# Patient Record
Sex: Male | Born: 1945 | Race: White | Hispanic: No | Marital: Married | Smoking: Never smoker
Health system: Southern US, Community
[De-identification: ages and names within clinical notes are randomized; demographics above are authoritative.]

## PROBLEM LIST (undated history)

## (undated) DIAGNOSIS — F909 Attention-deficit hyperactivity disorder, unspecified type: Secondary | ICD-10-CM

## (undated) DIAGNOSIS — I480 Paroxysmal atrial fibrillation: Secondary | ICD-10-CM

## (undated) DIAGNOSIS — N529 Male erectile dysfunction, unspecified: Secondary | ICD-10-CM

## (undated) HISTORY — DX: Paroxysmal atrial fibrillation: I48.0

## (undated) HISTORY — PX: NO PAST SURGERIES: SHX2092

## (undated) HISTORY — PX: COLONOSCOPY WITH PROPOFOL: SHX5780

## (undated) HISTORY — PX: SHOULDER SURGERY: SHX246

---

## 2010-10-09 ENCOUNTER — Ambulatory Visit: Payer: Self-pay | Admitting: Gastroenterology

## 2011-07-18 ENCOUNTER — Ambulatory Visit: Payer: Self-pay | Admitting: Family Medicine

## 2012-09-23 IMAGING — US US CAROTID DUPLEX BILAT
1 series · 17 of 24 positions shown · non-contrast
Comparison: none

REASON FOR EXAM: Visual Disturbance
COMMENTS:

PROCEDURE:     GOZZI - GOZZI CAROTID DOPPLER BILATERAL  - July 18, 2011  [DATE]
RESULT:     Comparison: None
TECHNIQUE: Gray-scale, color Doppler, and spectral Doppler images were
obtained of the extracranial carotid artery systems and vertebral arteries
in the neck.

[Series 1: us carotid duplex bilat · 17 of 72 slices shown]
[im 1/72]
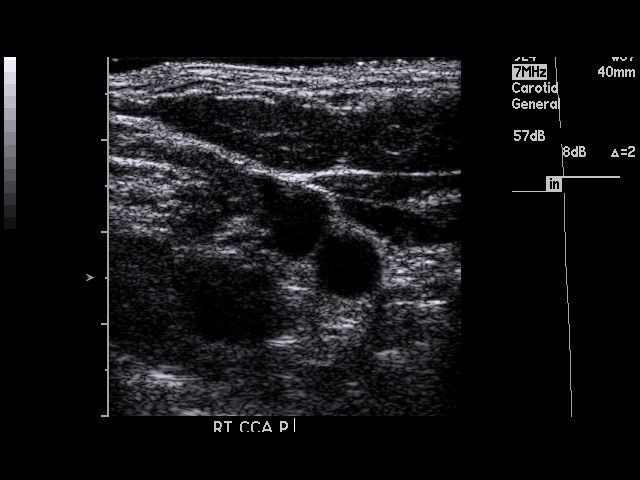
[im 7/72]
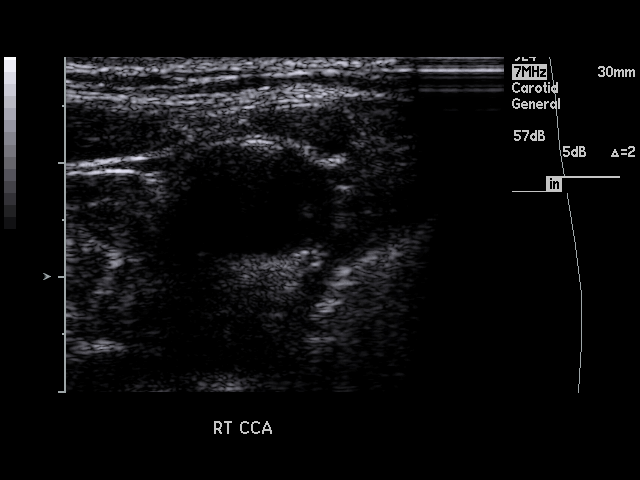
[im 10/72]
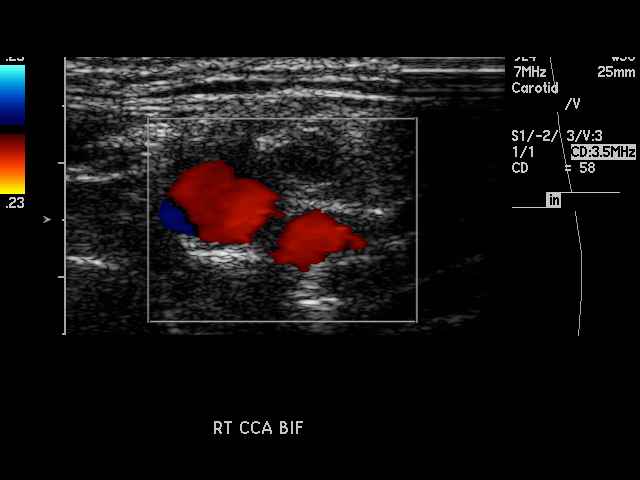
[im 13/72]
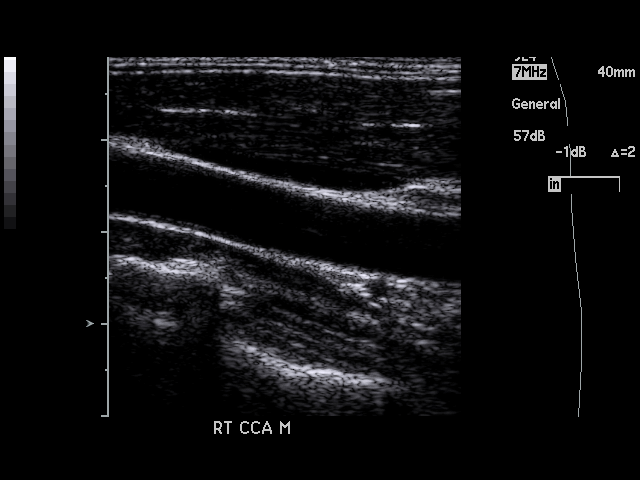
[im 19/72]
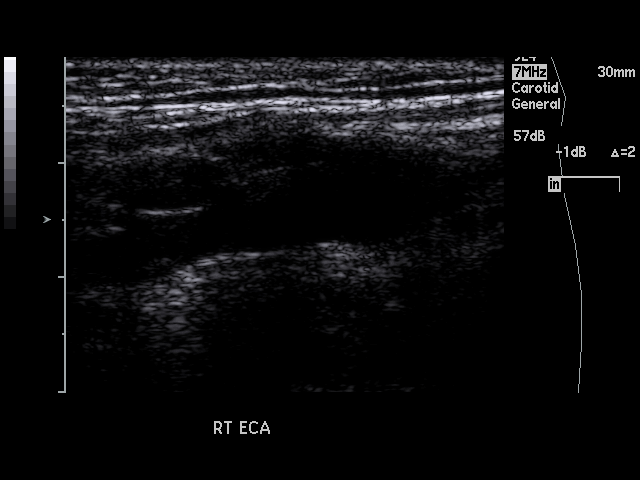
[im 22/72]
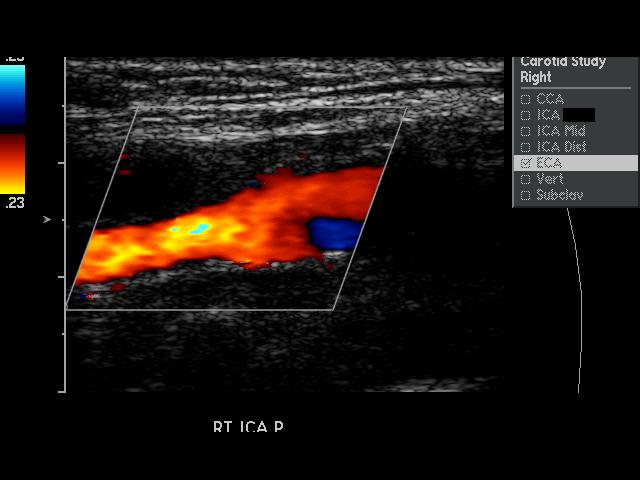
[im 28/72]
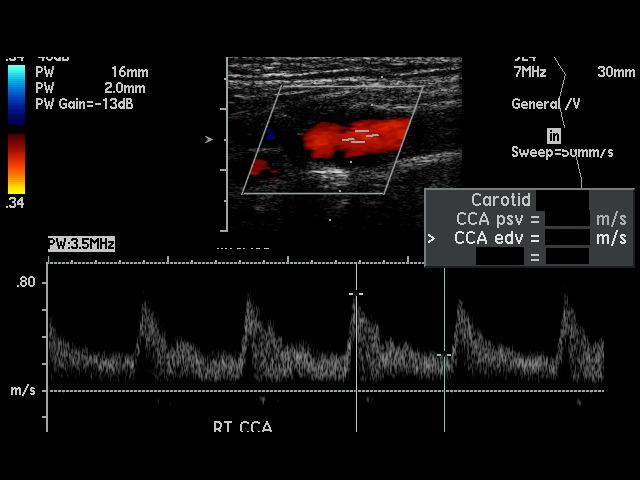
[im 31/72]
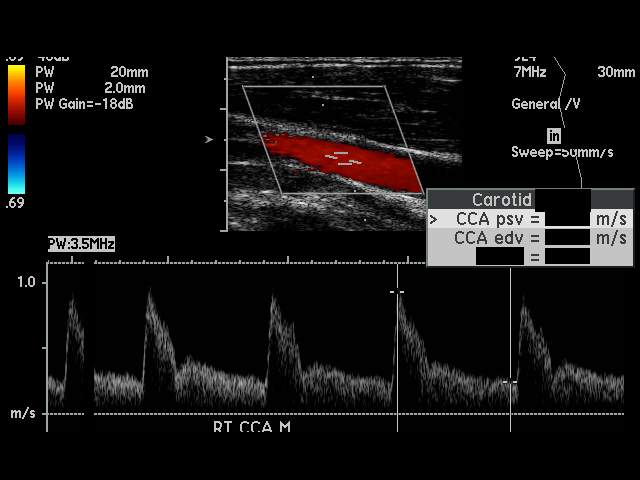
[im 38/72]
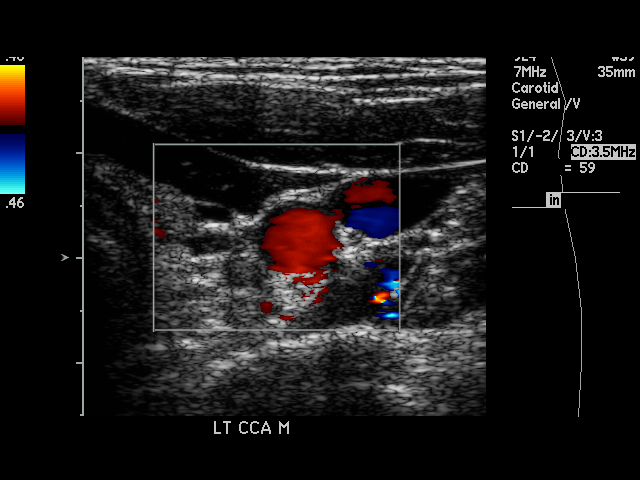
[im 41/72]
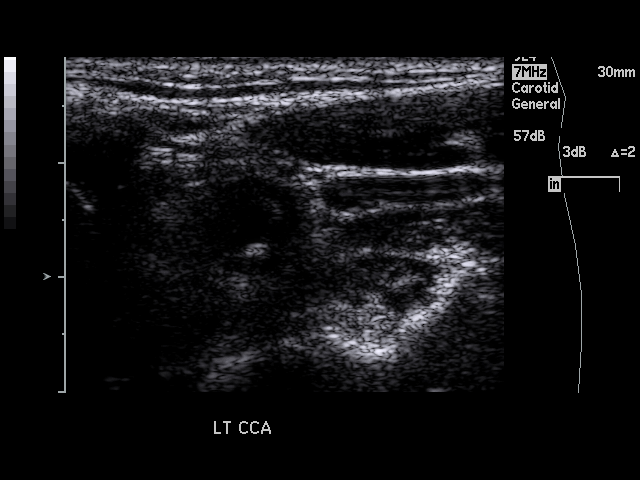
[im 44/72]
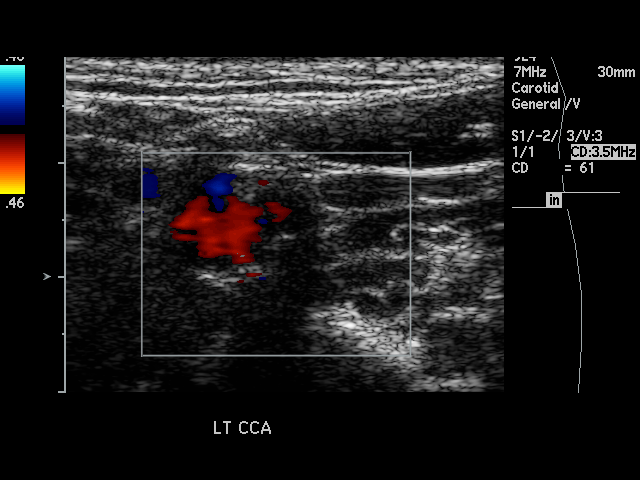
[im 50/72]
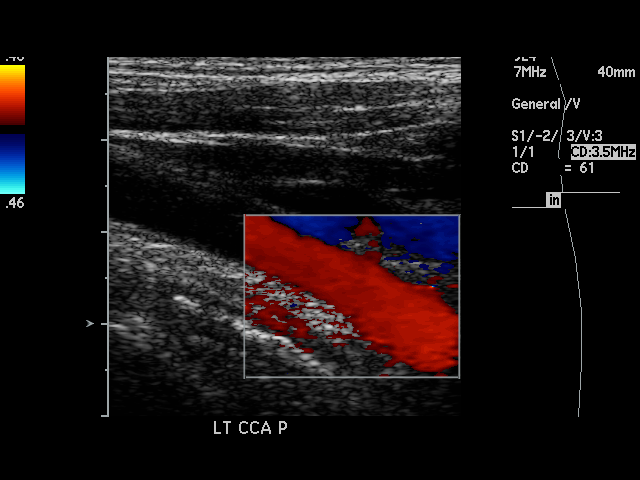
[im 53/72]
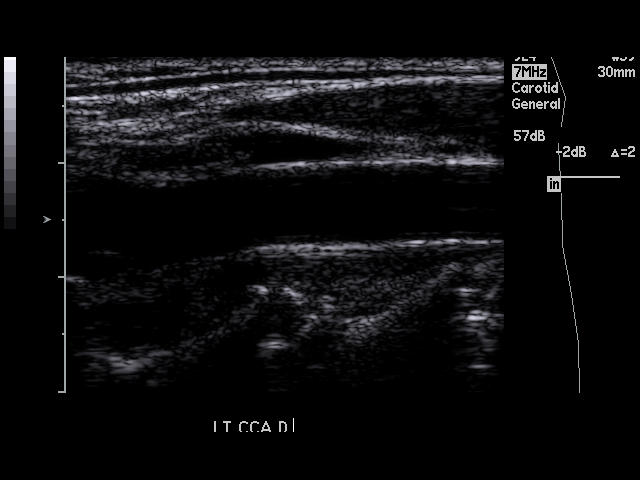
[im 59/72]
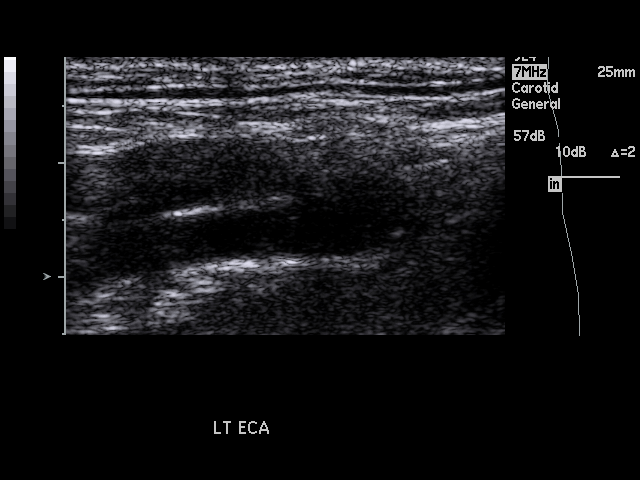
[im 62/72]
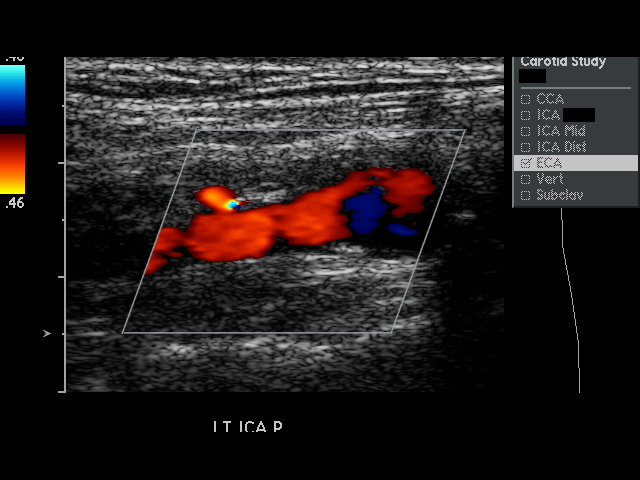
[im 65/72]
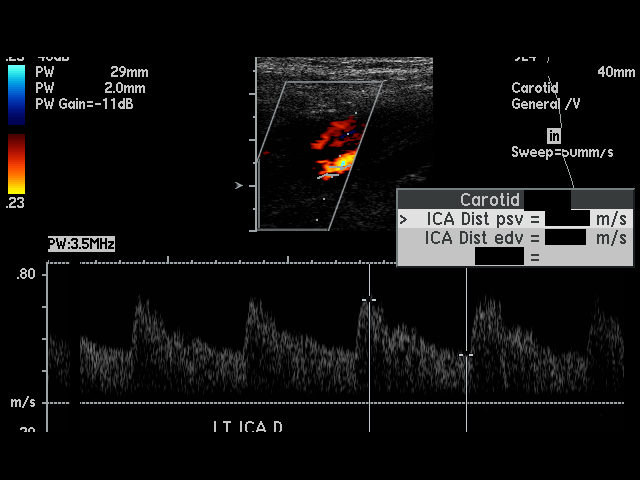
[im 72/72]
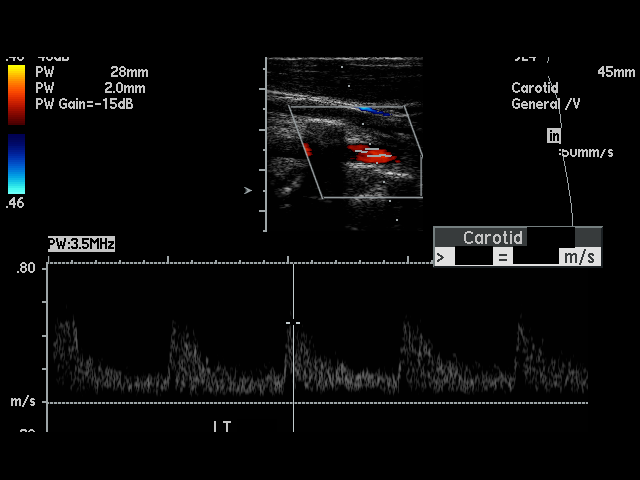

[17 of 24 positions shown; findings below may reference images not displayed]

FINDINGS: Mild atherosclerotic plaque is demonstrated in the right carotid bulb and
internal carotid artery. The peak systolic velocities in the right internal
carotid artery are not elevated. Mild to moderate atherosclerotic plaque is
demonstrated in the left common and internal carotid arteries. Shadowing
from the atherosclerotic plaque limits visualization. However, the peak
systolic velocities are not elevated. The right ICA to CCA ratio is 0.9. The
left ICA to CCA ratio 0.8.

The vertebral arteries are patent and demonstrate antegrade flow.
IMPRESSION: No evidence of hemodynamically significant stenosis in the extracranial
carotid arteries.

## 2018-01-15 ENCOUNTER — Other Ambulatory Visit: Payer: Self-pay | Admitting: Family Medicine

## 2018-01-15 DIAGNOSIS — H539 Unspecified visual disturbance: Secondary | ICD-10-CM

## 2018-01-28 ENCOUNTER — Ambulatory Visit
Admission: RE | Admit: 2018-01-28 | Discharge: 2018-01-28 | Disposition: A | Discharge status: Home / Self Care | Payer: Medicare Other | Source: Ambulatory Visit | Attending: Family Medicine | Admitting: Family Medicine

## 2018-01-28 DIAGNOSIS — H539 Unspecified visual disturbance: Secondary | ICD-10-CM | POA: Insufficient documentation

## 2018-09-09 IMAGING — US US CAROTID DUPLEX BILAT
1 series · 13 of 24 positions shown · non-contrast
Comparison: 07/18/2011

CLINICAL DATA: 72-year-old male with a history of visual
disturbance.

Cardiovascular risk factors listed are none
EXAM:
BILATERAL CAROTID DUPLEX ULTRASOUND
TECHNIQUE: Gray scale imaging, color Doppler and duplex ultrasound were
performed of bilateral carotid and vertebral arteries in the neck.

[Series 1: us carotid duplex bilat · 0.06mm/px · 13 of 70 slices shown]
[im 1/70]
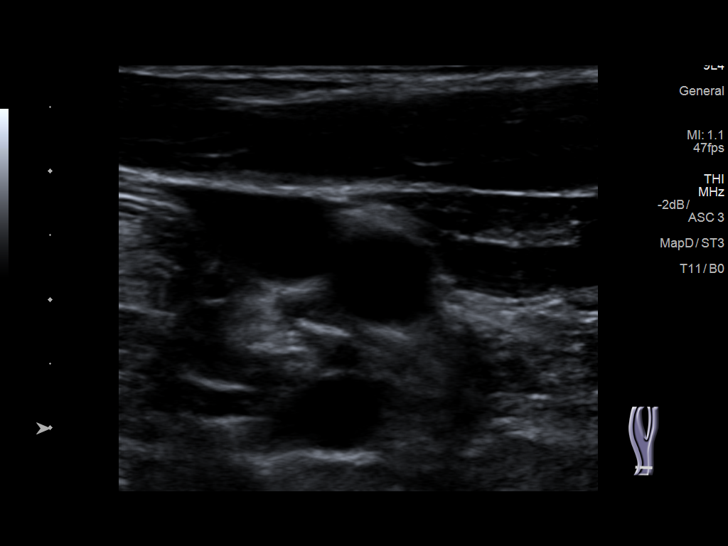
[im 7/70]
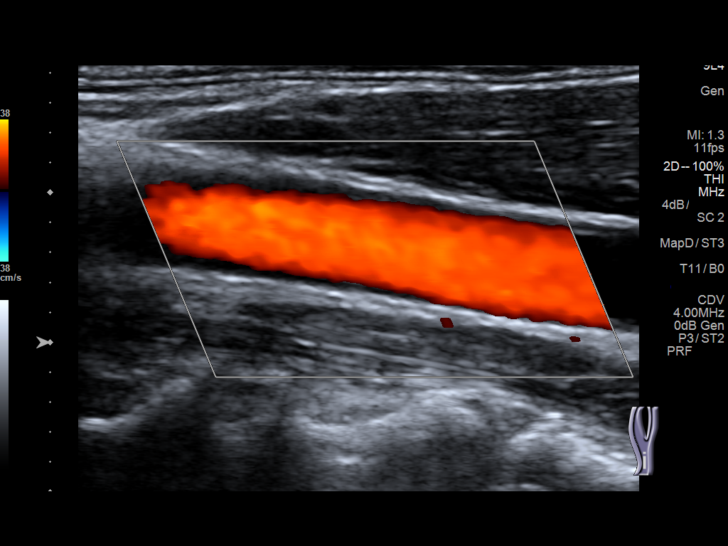
[im 13/70]
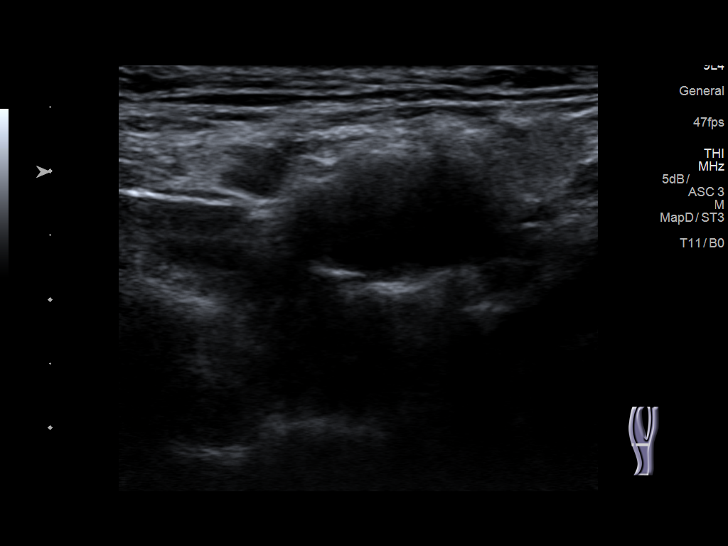
[im 19/70]
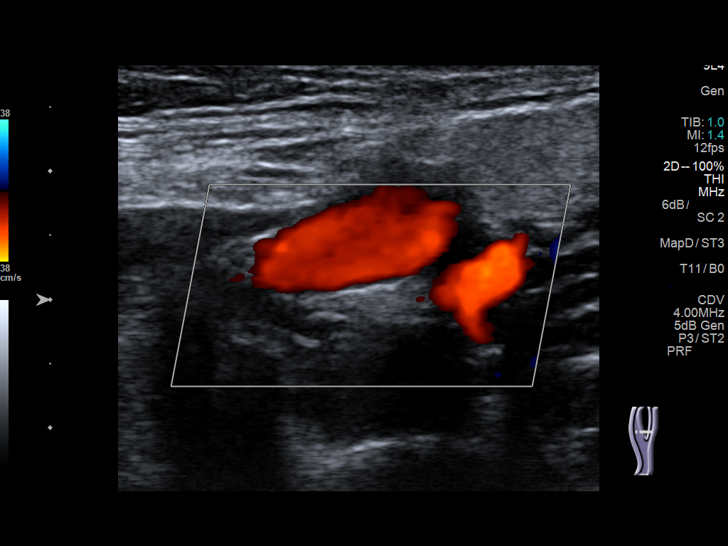
[im 25/70]
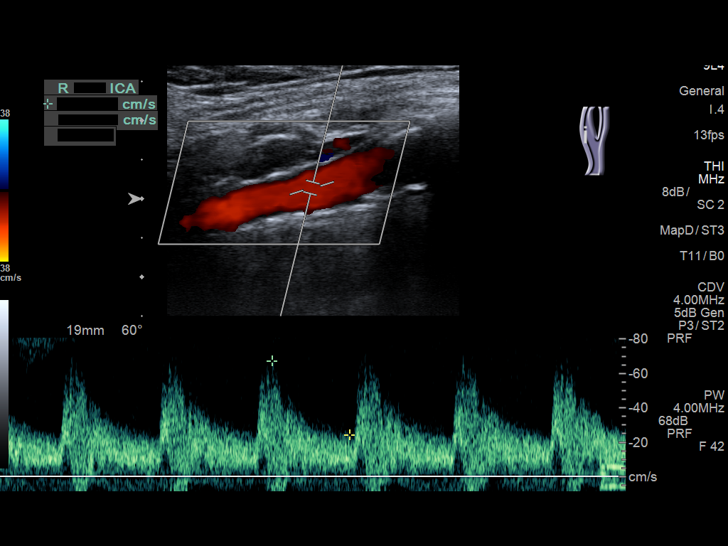
[im 31/70]
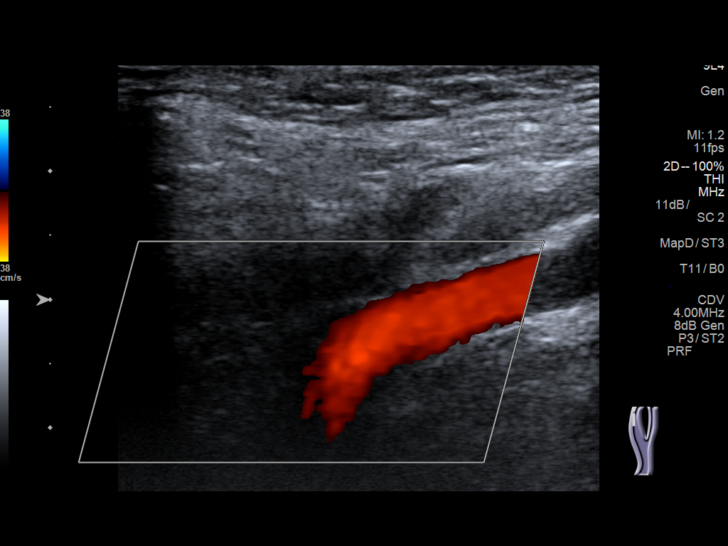
[im 37/70]
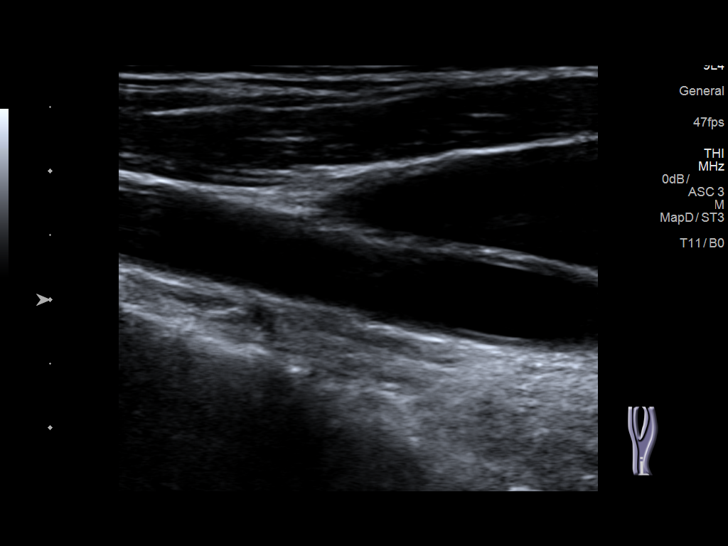
[im 40/70]
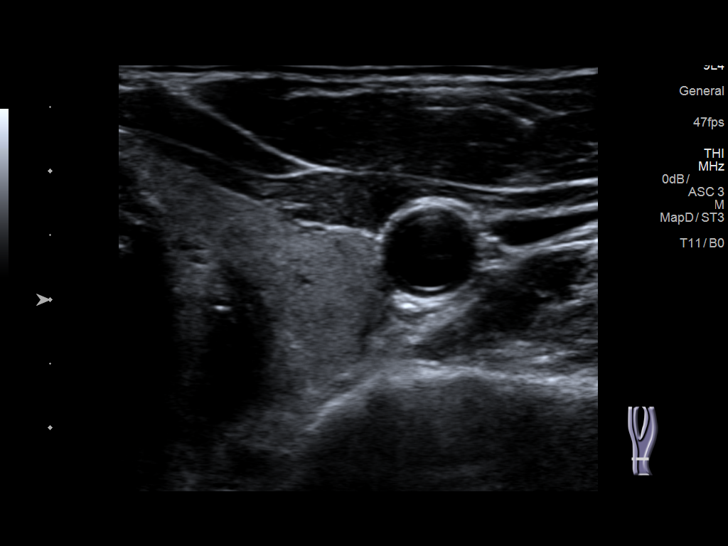
[im 46/70]
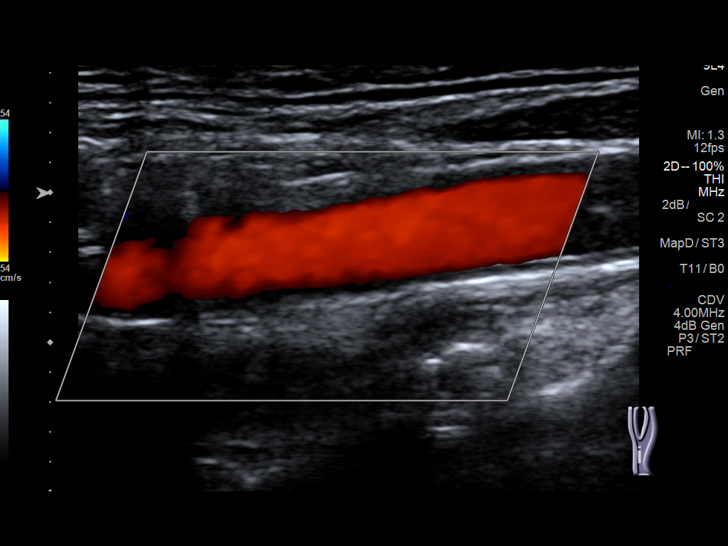
[im 52/70]
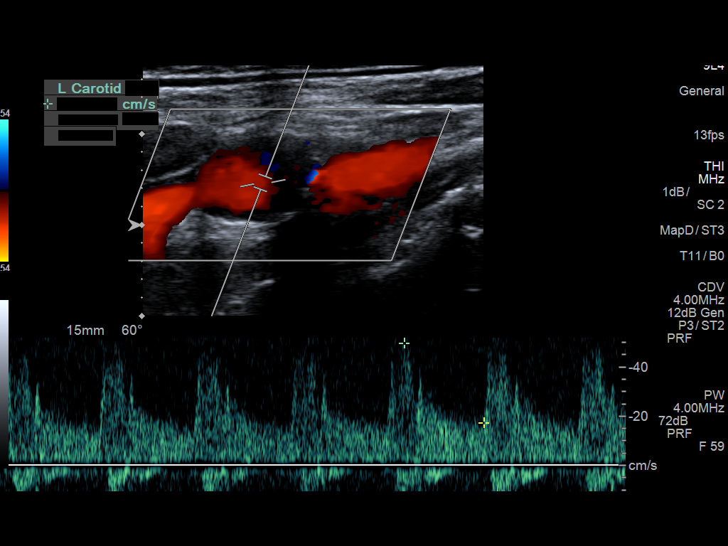
[im 58/70]
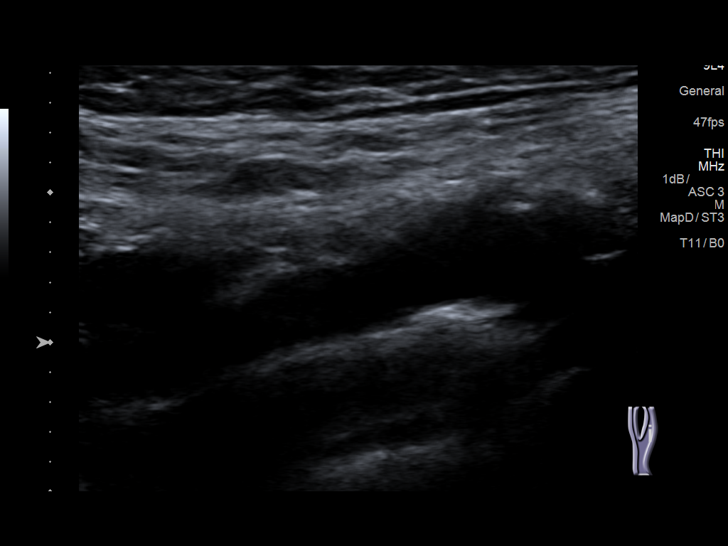
[im 64/70]
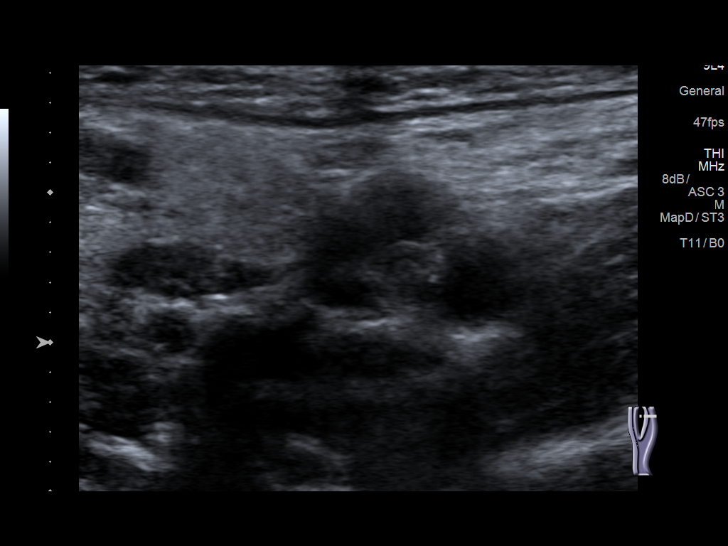
[im 70/70]
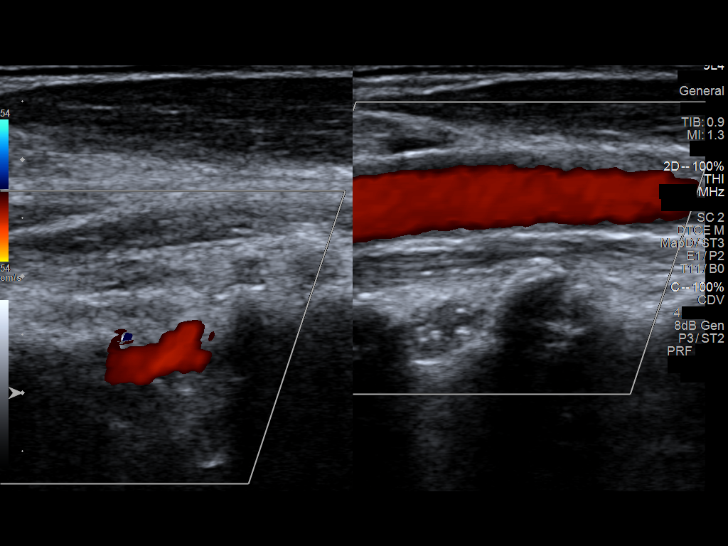

[13 of 24 positions shown; findings below may reference images not displayed]

FINDINGS: Criteria: Quantification of carotid stenosis is based on velocity
parameters that correlate the residual internal carotid diameter
with NASCET-based stenosis levels, using the diameter of the distal
internal carotid lumen as the denominator for stenosis measurement.

The following velocity measurements were obtained:

RIGHT

ICA:  Systolic 67 cm/sec, Diastolic 24 cm/sec

CCA:  80 cm/sec

SYSTOLIC ICA/CCA RATIO:

ECA:  79 cm/sec

LEFT

ICA:  Systolic 75 cm/sec, Diastolic 32 cm/sec

CCA:  88 cm/sec

SYSTOLIC ICA/CCA RATIO:

ECA:  122 cm/sec

Right Brachial SBP: Not acquired

Left Brachial SBP: Not acquired

RIGHT CAROTID ARTERY: No significant calcified disease of the right
common carotid artery. Intermediate waveform maintained.
Heterogeneous plaque without significant calcifications at the right
carotid bifurcation. Low resistance waveform of the right ICA. No
significant tortuosity.

RIGHT VERTEBRAL ARTERY: Antegrade flow with low resistance waveform.

LEFT CAROTID ARTERY: No significant calcified disease of the left
common carotid artery. Intermediate waveform maintained.
Heterogeneous plaque at the left carotid bifurcation without
significant calcifications. Low resistance waveform of the left ICA.

LEFT VERTEBRAL ARTERY:  Antegrade flow with low resistance waveform.
IMPRESSION: Color duplex indicates minimal heterogeneous plaque, with no
hemodynamically significant stenosis by duplex criteria in the
extracranial cerebrovascular circulation.

## 2020-06-26 ENCOUNTER — Ambulatory Visit: Payer: Self-pay | Attending: Internal Medicine

## 2020-06-26 DIAGNOSIS — Z23 Encounter for immunization: Secondary | ICD-10-CM

## 2020-06-26 NOTE — Progress Notes (Signed)
   Covid-19 Vaccination Clinic  Name:  Travis Lee    MRN: [id number] DOB: 10/28/1945  06/26/2020  [name] was observed post Covid-19 immunization for 15 minutes without incident. He was provided with Vaccine Information Sheet and instruction to access the V-Safe system.   [name] was instructed to call 911 with any severe reactions post vaccine: [name] Difficulty breathing  . Swelling of face and throat  . A fast heartbeat  . A bad rash all over body  . Dizziness and weakness

## 2020-12-18 DIAGNOSIS — H9121 Sudden idiopathic hearing loss, right ear: Secondary | ICD-10-CM | POA: Diagnosis not present

## 2020-12-18 DIAGNOSIS — H9311 Tinnitus, right ear: Secondary | ICD-10-CM | POA: Diagnosis not present

## 2020-12-18 DIAGNOSIS — H90A21 Sensorineural hearing loss, unilateral, right ear, with restricted hearing on the contralateral side: Secondary | ICD-10-CM | POA: Diagnosis not present

## 2021-01-08 DIAGNOSIS — H90A21 Sensorineural hearing loss, unilateral, right ear, with restricted hearing on the contralateral side: Secondary | ICD-10-CM | POA: Diagnosis not present

## 2021-02-21 DIAGNOSIS — S50811A Abrasion of right forearm, initial encounter: Secondary | ICD-10-CM | POA: Diagnosis not present

## 2021-02-21 DIAGNOSIS — H9191 Unspecified hearing loss, right ear: Secondary | ICD-10-CM | POA: Diagnosis not present

## 2021-02-21 DIAGNOSIS — R432 Parageusia: Secondary | ICD-10-CM | POA: Diagnosis not present

## 2021-04-12 DIAGNOSIS — Z0000 Encounter for general adult medical examination without abnormal findings: Secondary | ICD-10-CM | POA: Diagnosis not present

## 2021-04-12 DIAGNOSIS — X32XXXA Exposure to sunlight, initial encounter: Secondary | ICD-10-CM | POA: Diagnosis not present

## 2021-04-12 DIAGNOSIS — L84 Corns and callosities: Secondary | ICD-10-CM | POA: Diagnosis not present

## 2021-04-12 DIAGNOSIS — L570 Actinic keratosis: Secondary | ICD-10-CM | POA: Diagnosis not present

## 2021-05-30 DIAGNOSIS — E785 Hyperlipidemia, unspecified: Secondary | ICD-10-CM | POA: Diagnosis not present

## 2021-06-04 DIAGNOSIS — Z125 Encounter for screening for malignant neoplasm of prostate: Secondary | ICD-10-CM | POA: Diagnosis not present

## 2021-06-04 DIAGNOSIS — E785 Hyperlipidemia, unspecified: Secondary | ICD-10-CM | POA: Diagnosis not present

## 2021-06-04 DIAGNOSIS — Z0000 Encounter for general adult medical examination without abnormal findings: Secondary | ICD-10-CM | POA: Diagnosis not present

## 2021-06-14 DIAGNOSIS — D649 Anemia, unspecified: Secondary | ICD-10-CM | POA: Diagnosis not present

## 2021-06-14 DIAGNOSIS — Z0000 Encounter for general adult medical examination without abnormal findings: Secondary | ICD-10-CM | POA: Diagnosis not present

## 2021-06-14 DIAGNOSIS — Z0001 Encounter for general adult medical examination with abnormal findings: Secondary | ICD-10-CM | POA: Diagnosis not present

## 2021-06-14 DIAGNOSIS — E785 Hyperlipidemia, unspecified: Secondary | ICD-10-CM | POA: Diagnosis not present

## 2021-06-25 DIAGNOSIS — H25013 Cortical age-related cataract, bilateral: Secondary | ICD-10-CM | POA: Diagnosis not present

## 2021-06-25 DIAGNOSIS — H5203 Hypermetropia, bilateral: Secondary | ICD-10-CM | POA: Diagnosis not present

## 2021-06-25 DIAGNOSIS — H2513 Age-related nuclear cataract, bilateral: Secondary | ICD-10-CM | POA: Diagnosis not present

## 2021-06-25 DIAGNOSIS — H524 Presbyopia: Secondary | ICD-10-CM | POA: Diagnosis not present

## 2021-07-17 DIAGNOSIS — L400 Psoriasis vulgaris: Secondary | ICD-10-CM | POA: Diagnosis not present

## 2021-07-17 DIAGNOSIS — D2262 Melanocytic nevi of left upper limb, including shoulder: Secondary | ICD-10-CM | POA: Diagnosis not present

## 2021-07-17 DIAGNOSIS — C44722 Squamous cell carcinoma of skin of right lower limb, including hip: Secondary | ICD-10-CM | POA: Diagnosis not present

## 2021-07-17 DIAGNOSIS — D2261 Melanocytic nevi of right upper limb, including shoulder: Secondary | ICD-10-CM | POA: Diagnosis not present

## 2021-07-17 DIAGNOSIS — L218 Other seborrheic dermatitis: Secondary | ICD-10-CM | POA: Diagnosis not present

## 2021-09-11 DIAGNOSIS — Z1211 Encounter for screening for malignant neoplasm of colon: Secondary | ICD-10-CM | POA: Diagnosis not present

## 2021-10-08 DIAGNOSIS — L905 Scar conditions and fibrosis of skin: Secondary | ICD-10-CM | POA: Diagnosis not present

## 2021-10-08 DIAGNOSIS — C44722 Squamous cell carcinoma of skin of right lower limb, including hip: Secondary | ICD-10-CM | POA: Diagnosis not present

## 2021-11-23 ENCOUNTER — Encounter: Payer: Self-pay | Admitting: Gastroenterology

## 2021-11-25 ENCOUNTER — Encounter: Payer: Self-pay | Admitting: Gastroenterology

## 2021-11-25 NOTE — H&P (Signed)
? ?Pre-Procedure H&P ?  ?Patient ID: Travis Lee is a 76 y.o. male. ? ?Gastroenterology Provider: Jaynie Collins, DO ? ?Referring Provider: Tawni Pummel, PA ?PCP: [name], MD ? ?Date: 11/26/2021 ? ?HPI ?Travis Lee is a 76 y.o. male who presents today for Colonoscopy for screening colonoscopy. ?Patient with normal bowel moods without melena hematochezia diarrhea or constipation.  Patient is physically active and denies NSAID use. ?No family history of colorectal cancer or colon polyps. ?Last reported labs creatinine 1.2 hemoglobin 12.3 MCV 98.6 platelets 159,000.  Normal folate and B12 levels.  Negative FOBT. ? ?Last colonoscopy in 2012 normal with recommended 10-year repeat ? ?Past Medical History:  ?Diagnosis Date  ? ADHD (attention deficit hyperactivity disorder)   ? ED (erectile dysfunction)   ? ? ?Past Surgical History:  ?Procedure Laterality Date  ? COLONOSCOPY WITH PROPOFOL    ? NO PAST SURGERIES    ? SHOULDER SURGERY    ? ? ?Family History ?No h/o GI disease or malignancy ? ?Review of Systems  ?Constitutional:  Negative for activity change, appetite change, chills, diaphoresis, fatigue, fever and unexpected weight change.  ?HENT:  Negative for trouble swallowing and voice change.   ?Respiratory:  Negative for shortness of breath and wheezing.   ?Cardiovascular:  Negative for chest pain, palpitations and leg swelling.  ?Gastrointestinal:  Negative for abdominal distention, abdominal pain, anal bleeding, blood in stool, constipation, diarrhea, nausea and vomiting.  ?Musculoskeletal:  Negative for arthralgias and myalgias.  ?Skin:  Negative for color change and pallor.  ?Neurological:  Negative for dizziness, syncope and weakness.  ?Psychiatric/Behavioral:  Negative for confusion. The patient is not nervous/anxious.   ?All other systems reviewed and are negative.  ? ?Medications ?No current facility-administered medications on file prior to encounter.  ? ?Current Outpatient  Medications on File Prior to Encounter  ?Medication Sig Dispense Refill  ? ketoconazole (NIZORAL) 2 % shampoo Apply 1 application. topically as needed for irritation.    ? sildenafil (REVATIO) 20 MG tablet Take 20 mg by mouth as needed. 1-5tabs    ? tadalafil (CIALIS) 5 MG tablet Take 5 mg by mouth daily at 8 pm.    ? ? ?Pertinent medications related to GI and procedure were reviewed by me with the patient prior to the procedure ? ? ?Current Facility-Administered Medications:  ?  0.9 %  sodium chloride infusion, , Intravenous, Continuous, Jaynie Collins, DO, Last Rate: 20 mL/hr at 11/26/21 1317, New Bag at 11/26/21 1317 ?  ?  ? ?Not on File ?Allergies were reviewed by me prior to the procedure ? ?Objective  ? ?Body mass index is 23.75 kg/m?. ?Vitals:  ? 11/26/21 1252  ?BP: (!) 122/94  ?Pulse: 64  ?Resp: 20  ?Temp: 97.8 ?F (36.6 ?C)  ?TempSrc: Temporal  ?SpO2: 100%  ?Weight: 81.6 kg  ?Height: 6\' 1"  (1.854 m)  ? ? ? ?Physical Exam ?Vitals and nursing note reviewed.  ?Constitutional:   ?   General: He is not in acute distress. ?   Appearance: Normal appearance. He is not ill-appearing, toxic-appearing or diaphoretic.  ?HENT:  ?   Head: Normocephalic and atraumatic.  ?   Nose: Nose normal.  ?   Mouth/Throat:  ?   Mouth: Mucous membranes are moist.  ?   Pharynx: Oropharynx is clear.  ?Eyes:  ?   General: No scleral icterus. ?   Extraocular Movements: Extraocular movements intact.  ?Cardiovascular:  ?   Rate and Rhythm: Normal rate and regular rhythm.  ?  Heart sounds: Normal heart sounds. No murmur heard. ?  No friction rub. No gallop.  ?Pulmonary:  ?   Effort: Pulmonary effort is normal. No respiratory distress.  ?   Breath sounds: Normal breath sounds. No wheezing, rhonchi or rales.  ?Abdominal:  ?   General: Bowel sounds are normal. There is no distension.  ?   Palpations: Abdomen is soft.  ?   Tenderness: There is no abdominal tenderness. There is no guarding or rebound.  ?Musculoskeletal:  ?   Cervical back:  Neck supple.  ?   Right lower leg: No edema.  ?   Left lower leg: No edema.  ?Skin: ?   General: Skin is warm and dry.  ?   Coloration: Skin is not jaundiced or pale.  ?Neurological:  ?   General: No focal deficit present.  ?   Mental Status: He is alert and oriented to person, place, and time. Mental status is at baseline.  ?Psychiatric:     ?   Mood and Affect: Mood normal.     ?   Behavior: Behavior normal.     ?   Thought Content: Thought content normal.     ?   Judgment: Judgment normal.  ? ? ? ?Assessment:  ?Travis Lee is a 76 y.o. male  who presents today for Colonoscopy for screening colonoscopy. ? ?Plan:  ?Colonoscopy with possible intervention today ? ?Colonoscopy with possible biopsy, control of bleeding, polypectomy, and interventions as necessary has been discussed with the patient/patient representative. Informed consent was obtained from the patient/patient representative after explaining the indication, nature, and risks of the procedure including but not limited to death, bleeding, perforation, missed neoplasm/lesions, cardiorespiratory compromise, and reaction to medications. Opportunity for questions was given and appropriate answers were provided. Patient/patient representative has verbalized understanding is amenable to undergoing the procedure. ? ? ?Jaynie Collins, DO  ?[facility] ? ?Portions of the record may have been created with voice recognition software. Occasional wrong-word or 'sound-a-like' substitutions may have occurred due to the inherent limitations of voice recognition software.  Read the chart carefully and recognize, using context, where substitutions may have occurred. ?

## 2021-11-26 ENCOUNTER — Encounter: Payer: Self-pay | Admitting: Gastroenterology

## 2021-11-26 ENCOUNTER — Ambulatory Visit: Payer: Medicare Other | Admitting: Certified Registered Nurse Anesthetist

## 2021-11-26 ENCOUNTER — Encounter
Admission: RE | Disposition: A | Discharge status: Home / Self Care | Payer: Self-pay | Source: Home / Self Care | Attending: Gastroenterology

## 2021-11-26 ENCOUNTER — Ambulatory Visit
Admission: RE | Admit: 2021-11-26 | Discharge: 2021-11-26 | Disposition: A | Discharge status: Home / Self Care | Payer: Medicare Other | Attending: Gastroenterology | Admitting: Gastroenterology

## 2021-11-26 DIAGNOSIS — D126 Benign neoplasm of colon, unspecified: Secondary | ICD-10-CM | POA: Diagnosis not present

## 2021-11-26 DIAGNOSIS — K640 First degree hemorrhoids: Secondary | ICD-10-CM | POA: Diagnosis not present

## 2021-11-26 DIAGNOSIS — Z1211 Encounter for screening for malignant neoplasm of colon: Secondary | ICD-10-CM | POA: Diagnosis not present

## 2021-11-26 DIAGNOSIS — K635 Polyp of colon: Secondary | ICD-10-CM | POA: Insufficient documentation

## 2021-11-26 DIAGNOSIS — K5730 Diverticulosis of large intestine without perforation or abscess without bleeding: Secondary | ICD-10-CM | POA: Insufficient documentation

## 2021-11-26 HISTORY — DX: Attention-deficit hyperactivity disorder, unspecified type: F90.9

## 2021-11-26 HISTORY — DX: Male erectile dysfunction, unspecified: N52.9

## 2021-11-26 HISTORY — PX: COLONOSCOPY WITH PROPOFOL: SHX5780

## 2021-11-26 SURGERY — COLONOSCOPY WITH PROPOFOL
Anesthesia: General

## 2021-11-26 MED ORDER — SODIUM CHLORIDE 0.9 % IV SOLN: INTRAVENOUS | Status: DC

## 2021-11-26 MED ORDER — STERILE WATER FOR IRRIGATION IR SOLN
Status: DC | PRN
  Administered 2021-11-26: 100 mL

## 2021-11-26 MED ORDER — PROPOFOL 500 MG/50ML IV EMUL
INTRAVENOUS | Status: DC | PRN
  Administered 2021-11-26: 150 ug/kg/min via INTRAVENOUS

## 2021-11-26 MED ORDER — PROPOFOL 10 MG/ML IV BOLUS
INTRAVENOUS | Status: DC | PRN
  Administered 2021-11-26: 60 mg via INTRAVENOUS

## 2021-11-26 MED ORDER — LIDOCAINE HCL (CARDIAC) PF 100 MG/5ML IV SOSY
PREFILLED_SYRINGE | INTRAVENOUS | Status: DC | PRN
  Administered 2021-11-26: 50 mg via INTRAVENOUS

## 2021-11-26 NOTE — Anesthesia Procedure Notes (Signed)
Procedure Name: Banks ?Date/Time: 11/26/2021 1:58 PM ?Performed by: Tollie Eth, CRNA ?Pre-anesthesia Checklist: Patient identified, Emergency Drugs available, Suction available and Patient being monitored ?Patient Re-evaluated:Patient Re-evaluated prior to induction ?Oxygen Delivery Method: Nasal cannula ?Induction Type: IV induction ?Placement Confirmation: positive ETCO2 ? ? ? ? ?

## 2021-11-26 NOTE — Op Note (Signed)
[facility] ?Gastroenterology ?Patient Name: Travis Lee ?Procedure Date: 11/26/2021 1:49 PM ?MRN: [id number] ?Account #: [id number] ?Date of Birth: [birth year] ?Admit Type: Outpatient ?Age: 76 ?Room: [facility] ENDO ROOM 2 ?Gender: Male ?Note Status: Finalized ?Instrument Name: Colonscope [id number] ?Procedure:             Colonoscopy ?Indications:           Screening for colorectal malignant neoplasm ?Providers:             Annamaria Helling DO, DO ?Referring MD:          Irven Easterly. Kary Kos, MD (Referring MD) ?Medicines:             Monitored Anesthesia Care ?Complications:         No immediate complications. Estimated blood loss:  ?                       Minimal. ?Procedure:             Pre-Anesthesia Assessment: ?                       - Prior to the procedure, a History and Physical was  ?                       performed, and patient medications and allergies were  ?                       reviewed. The patient is competent. The risks and  ?                       benefits of the procedure and the sedation options and  ?                       risks were discussed with the patient. All questions  ?                       were answered and informed consent was obtained.  ?                       Patient identification and proposed procedure were  ?                       verified by the physician, the nurse, the anesthetist  ?                       and the technician in the endoscopy suite. Mental  ?                       Status Examination: alert and oriented. Airway  ?                       Examination: normal oropharyngeal airway and neck  ?                       mobility. Respiratory Examination: clear to  ?                       auscultation. CV Examination: RRR, no murmurs, no S3  ?  or S4. Prophylactic Antibiotics: The patient does not  ?                       require prophylactic antibiotics. Prior  ?                       Anticoagulants: The patient has taken no previous  ?                        anticoagulant or antiplatelet agents. ASA Grade  ?                       Assessment: I - A normal, healthy patient. After  ?                       reviewing the risks and benefits, the patient was  ?                       deemed in satisfactory condition to undergo the  ?                       procedure. The anesthesia plan was to use monitored  ?                       anesthesia care (MAC). Immediately prior to  ?                       administration of medications, the patient was  ?                       re-assessed for adequacy to receive sedatives. The  ?                       heart rate, respiratory rate, oxygen saturations,  ?                       blood pressure, adequacy of pulmonary ventilation, and  ?                       response to care were monitored throughout the  ?                       procedure. The physical status of the patient was  ?                       re-assessed after the procedure. ?                       After obtaining informed consent, the colonoscope was  ?                       passed under direct vision. Throughout the procedure,  ?                       the patient's blood pressure, pulse, and oxygen  ?                       saturations were monitored continuously. The  ?  Colonoscope was introduced through the anus and  ?                       advanced to the the terminal ileum, with  ?                       identification of the appendiceal orifice and IC  ?                       valve. The colonoscopy was performed without  ?                       difficulty. The patient tolerated the procedure well.  ?                       The quality of the bowel preparation was evaluated  ?                       using the BBPS [facility] Bowel Preparation Scale) with  ?                       scores of: Right Colon = 3, Transverse Colon = 3 and  ?                       Left Colon = 3 (entire mucosa seen well with no  ?                       residual  staining, small fragments of stool or opaque  ?                       liquid). The total BBPS score equals 9. The terminal  ?                       ileum, ileocecal valve, appendiceal orifice, and  ?                       rectum were photographed. ?Findings: ?     The perianal and digital rectal examinations were normal. Pertinent  ?     negatives include normal sphincter tone. ?     The terminal ileum appeared normal. Estimated blood loss: none. ?     Non-bleeding internal hemorrhoids were found during retroflexion. The  ?     hemorrhoids were Grade I (internal hemorrhoids that do not prolapse).  ?     Estimated blood loss: none. ?     A few small-mouthed diverticula were found in the entire colon.  ?     Estimated blood loss: none. ?     A 1 to 2 mm polyp was found in the descending colon. The polyp was  ?     sessile. The polyp was removed with a cold biopsy forceps. Resection and  ?     retrieval were complete. Estimated blood loss was minimal. ?     The exam was otherwise without abnormality on direct and retroflexion  ?     views. ?Impression:            - The examined portion of the ileum was normal. ?                       -  Non-bleeding internal hemorrhoids. ?                       - Diverticulosis in the entire examined colon. ?                       - One 1 to 2 mm polyp in the descending colon, removed  ?                       with a cold biopsy forceps. Resected and retrieved. ?                       - The examination was otherwise normal on direct and  ?                       retroflexion views. ?Recommendation:        - Discharge patient to home. ?                       - Resume previous diet. ?                       - Continue present medications. ?                       - Await pathology results. ?                       - Repeat colonoscopy for surveillance based on  ?                       pathology results. ?                       - Return to referring physician as previously  ?                        scheduled. ?                       - The findings and recommendations were discussed with  ?                       the patient. ?Procedure Code(s):     --- Professional --- ?                       [phone], Colonoscopy, flexible; with biopsy, single or  ?                       multiple ?Diagnosis Code(s):     --- Professional --- ?                       Z12.11, Encounter for screening for malignant neoplasm  ?                       of colon ?                       K64.0, First degree hemorrhoids ?  K63.5, Polyp of colon ?                       K57.30, Diverticulosis of large intestine without  ?                       perforation or abscess without bleeding ?CPT copyright 2019 [employer]. All rights reserved. ?The codes documented in this report are preliminary and upon coder review may  ?be revised to meet current compliance requirements. ?Attending Participation: ?     I personally performed the entire procedure. ?Volney American, DO ?Annamaria Helling DO, DO ?11/26/2021 2:22:21 PM ?This report has been signed electronically. ?Number of Addenda: 0 ?Note Initiated On: 11/26/2021 1:49 PM ?Scope Withdrawal Time: 0 hours 11 minutes 19 seconds  ?Total Procedure Duration: 0 hours 14 minutes 33 seconds  ?Estimated Blood Loss:  Estimated blood loss was minimal. ?     [facility] ?

## 2021-11-26 NOTE — Transfer of Care (Signed)
Immediate Anesthesia Transfer of Care Note ? ?Patient: Travis Lee ? ?Procedure(s) Performed: COLONOSCOPY WITH PROPOFOL ? ?Patient Location: Endoscopy Unit ? ?Anesthesia Type:General ? ?Level of Consciousness: drowsy ? ?Airway & Oxygen Therapy: Patient Spontanous Breathing ? ?Post-op Assessment: Report given to RN and Post -op Vital signs reviewed and stable ? ?Post vital signs: Reviewed and stable ? ?Last Vitals:  ?Vitals Value Taken Time  ?BP 86/56 11/26/21 1421  ?Temp    ?Pulse 60 11/26/21 1421  ?Resp 14 11/26/21 1421  ?SpO2 100 % 11/26/21 1421  ? ? ?Last Pain:  ?Vitals:  ? 11/26/21 1421  ?TempSrc:   ?PainSc: Asleep  ?   ? ?  ? ?Complications: No notable events documented. ?

## 2021-11-26 NOTE — Interval H&P Note (Signed)
History and Physical Interval Note: Preprocedure H&P from 11/26/21 ? was reviewed and there was no interval change after seeing and examining the patient.  Written consent was obtained from the patient after discussion of risks, benefits, and alternatives. Patient has consented to proceed with Colonoscopy with possible intervention ? ? ?11/26/2021 ?1:54 PM ? ?Travis Lee  has presented today for surgery, with the diagnosis of Z12.11  - Colon cancer screening.  The various methods of treatment have been discussed with the patient and family. After consideration of risks, benefits and other options for treatment, the patient has consented to  Procedure(s): ?COLONOSCOPY WITH PROPOFOL (N/A) as a surgical intervention.  The patient's history has been reviewed, patient examined, no change in status, stable for surgery.  I have reviewed the patient's chart and labs.  Questions were answered to the patient's satisfaction.   ? ? ?Jaynie Collins ? ? ?

## 2021-11-26 NOTE — Anesthesia Preprocedure Evaluation (Signed)
Anesthesia Evaluation  ?Patient identified by MRN, date of birth, ID band ?Patient awake ? ? ? ?Reviewed: ?Allergy & Precautions, NPO status , Patient's Chart, lab work & pertinent test results ? ?History of Anesthesia Complications ?Negative for: history of anesthetic complications ? ?Airway ?Mallampati: I ? ? ?Neck ROM: Full ? ? ? Dental ?no notable dental hx. ? ?  ?Pulmonary ?neg pulmonary ROS,  ?  ?Pulmonary exam normal ?breath sounds clear to auscultation ? ? ? ? ? ? Cardiovascular ?Exercise Tolerance: Good ?negative cardio ROS ?Normal cardiovascular exam ?Rhythm:Regular Rate:Normal ? ? ?  ?Neuro/Psych ?PSYCHIATRIC DISORDERS (ADHD) negative neurological ROS ?   ? GI/Hepatic ?negative GI ROS,   ?Endo/Other  ?negative endocrine ROS ? Renal/GU ?negative Renal ROS  ? ?  ?Musculoskeletal ? ? Abdominal ?  ?Peds ? Hematology ?negative hematology ROS ?(+)   ?Anesthesia Other Findings ? ? Reproductive/Obstetrics ? ?  ? ? ? ? ? ? ? ? ? ? ? ? ? ?  ?  ? ? ? ? ? ? ? ? ?Anesthesia Physical ?Anesthesia Plan ? ?ASA: 1 ? ?Anesthesia Plan: General  ? ?Post-op Pain Management:   ? ?Induction: Intravenous ? ?PONV Risk Score and Plan: 2 and Propofol infusion, TIVA and Treatment may vary due to age or medical condition ? ?Airway Management Planned: Natural Airway ? ?Additional Equipment:  ? ?Intra-op Plan:  ? ?Post-operative Plan:  ? ?Informed Consent: I have reviewed the patients History and Physical, chart, labs and discussed the procedure including the risks, benefits and alternatives for the proposed anesthesia with the patient or authorized representative who has indicated his/her understanding and acceptance.  ? ? ? ? ? ?Plan Discussed with: CRNA ? ?Anesthesia Plan Comments: (LMA/GETA backup discussed.  Patient consented for risks of anesthesia including but not limited to:  ?- adverse reactions to medications ?- damage to eyes, teeth, lips or other oral mucosa ?- nerve damage due to  positioning  ?- sore throat or hoarseness ?- damage to heart, brain, nerves, lungs, other parts of body or loss of life ? ?Informed patient about role of CRNA in peri- and intra-operative care.  Patient voiced understanding.)  ? ? ? ? ? ? ?Anesthesia Quick Evaluation ? ?

## 2021-11-27 ENCOUNTER — Encounter: Payer: Self-pay | Admitting: Gastroenterology

## 2021-11-28 LAB — SURGICAL PATHOLOGY

## 2021-11-29 NOTE — Anesthesia Postprocedure Evaluation (Signed)
Anesthesia Post Note ? ?Patient: Travis Lee ? ?Procedure(s) Performed: COLONOSCOPY WITH PROPOFOL ? ?Patient location during evaluation: PACU ?Anesthesia Type: General ?Level of consciousness: awake and alert ?Pain management: pain level controlled ?Vital Signs Assessment: post-procedure vital signs reviewed and stable ?Respiratory status: spontaneous breathing, nonlabored ventilation, respiratory function stable and patient connected to nasal cannula oxygen ?Cardiovascular status: blood pressure returned to baseline and stable ?Postop Assessment: no apparent nausea or vomiting ?Anesthetic complications: no ? ? ?No notable events documented. ? ? ?Last Vitals:  ?Vitals:  ? 11/26/21 1431 11/26/21 1441  ?BP: 98/68 103/75  ?Pulse: (!) 58 60  ?Resp: 18 18  ?Temp:    ?SpO2: 99% 100%  ?  ?Last Pain:  ?Vitals:  ? 11/27/21 0743  ?TempSrc:   ?PainSc: 0-No pain  ? ? ?  ?  ?  ?  ?  ?  ? ?Yevette Edwards ? ? ? ? ?

## 2021-12-12 DIAGNOSIS — C44529 Squamous cell carcinoma of skin of other part of trunk: Secondary | ICD-10-CM | POA: Diagnosis not present

## 2021-12-12 DIAGNOSIS — L538 Other specified erythematous conditions: Secondary | ICD-10-CM | POA: Diagnosis not present

## 2021-12-12 DIAGNOSIS — L820 Inflamed seborrheic keratosis: Secondary | ICD-10-CM | POA: Diagnosis not present

## 2021-12-12 DIAGNOSIS — D485 Neoplasm of uncertain behavior of skin: Secondary | ICD-10-CM | POA: Diagnosis not present

## 2022-01-02 DIAGNOSIS — L814 Other melanin hyperpigmentation: Secondary | ICD-10-CM | POA: Diagnosis not present

## 2022-01-02 DIAGNOSIS — L578 Other skin changes due to chronic exposure to nonionizing radiation: Secondary | ICD-10-CM | POA: Diagnosis not present

## 2022-01-02 DIAGNOSIS — C44529 Squamous cell carcinoma of skin of other part of trunk: Secondary | ICD-10-CM | POA: Diagnosis not present

## 2022-01-02 DIAGNOSIS — L988 Other specified disorders of the skin and subcutaneous tissue: Secondary | ICD-10-CM | POA: Diagnosis not present

## 2022-03-27 DIAGNOSIS — L400 Psoriasis vulgaris: Secondary | ICD-10-CM | POA: Diagnosis not present

## 2022-03-27 DIAGNOSIS — D0461 Carcinoma in situ of skin of right upper limb, including shoulder: Secondary | ICD-10-CM | POA: Diagnosis not present

## 2022-03-27 DIAGNOSIS — Z85828 Personal history of other malignant neoplasm of skin: Secondary | ICD-10-CM | POA: Diagnosis not present

## 2022-03-27 DIAGNOSIS — D2262 Melanocytic nevi of left upper limb, including shoulder: Secondary | ICD-10-CM | POA: Diagnosis not present

## 2022-03-27 DIAGNOSIS — D2261 Melanocytic nevi of right upper limb, including shoulder: Secondary | ICD-10-CM | POA: Diagnosis not present

## 2022-06-19 DIAGNOSIS — D0461 Carcinoma in situ of skin of right upper limb, including shoulder: Secondary | ICD-10-CM | POA: Diagnosis not present

## 2022-06-27 DIAGNOSIS — H5203 Hypermetropia, bilateral: Secondary | ICD-10-CM | POA: Diagnosis not present

## 2022-06-27 DIAGNOSIS — H25013 Cortical age-related cataract, bilateral: Secondary | ICD-10-CM | POA: Diagnosis not present

## 2022-06-27 DIAGNOSIS — H524 Presbyopia: Secondary | ICD-10-CM | POA: Diagnosis not present

## 2022-06-27 DIAGNOSIS — H2513 Age-related nuclear cataract, bilateral: Secondary | ICD-10-CM | POA: Diagnosis not present

## 2022-07-22 DIAGNOSIS — S81811A Laceration without foreign body, right lower leg, initial encounter: Secondary | ICD-10-CM | POA: Diagnosis not present

## 2022-07-22 DIAGNOSIS — L400 Psoriasis vulgaris: Secondary | ICD-10-CM | POA: Diagnosis not present

## 2022-07-27 ENCOUNTER — Emergency Department: Payer: Medicare Other

## 2022-07-27 ENCOUNTER — Emergency Department
Admission: EM | Admit: 2022-07-27 | Discharge: 2022-07-27 | Disposition: A | Discharge status: Home / Self Care | Payer: Medicare Other | Attending: Emergency Medicine | Admitting: Emergency Medicine

## 2022-07-27 DIAGNOSIS — R531 Weakness: Secondary | ICD-10-CM | POA: Diagnosis not present

## 2022-07-27 DIAGNOSIS — Z20822 Contact with and (suspected) exposure to covid-19: Secondary | ICD-10-CM | POA: Diagnosis not present

## 2022-07-27 DIAGNOSIS — I480 Paroxysmal atrial fibrillation: Secondary | ICD-10-CM | POA: Insufficient documentation

## 2022-07-27 DIAGNOSIS — R42 Dizziness and giddiness: Secondary | ICD-10-CM | POA: Diagnosis not present

## 2022-07-27 LAB — BASIC METABOLIC PANEL
Anion gap: 8 (ref 5–15)
BUN: 28 mg/dL — ABNORMAL HIGH (ref 8–23)
CO2: 25 mmol/L (ref 22–32)
Calcium: 9.1 mg/dL (ref 8.9–10.3)
Chloride: 105 mmol/L (ref 98–111)
Creatinine, Ser: 1.41 mg/dL — ABNORMAL HIGH (ref 0.61–1.24)
GFR, Estimated: 52 mL/min — ABNORMAL LOW (ref >60)
Glucose, Bld: 131 mg/dL — ABNORMAL HIGH (ref 70–99)
Potassium: 4.1 mmol/L (ref 3.5–5.1)
Sodium: 138 mmol/L (ref 135–145)

## 2022-07-27 LAB — CBG MONITORING, ED: Glucose-Capillary: 158 mg/dL — ABNORMAL HIGH (ref 70–99)

## 2022-07-27 LAB — RESP PANEL BY RT-PCR (RSV, FLU A&B, COVID)  RVPGX2
Influenza A by PCR: NEGATIVE
Influenza B by PCR: NEGATIVE
Resp Syncytial Virus by PCR: NEGATIVE
SARS Coronavirus 2 by RT PCR: NEGATIVE

## 2022-07-27 LAB — CBC
HCT: 38.9 % — ABNORMAL LOW (ref 39.0–52.0)
Hemoglobin: 13 g/dL (ref 13.0–17.0)
MCH: 34.4 pg — ABNORMAL HIGH (ref 26.0–34.0)
MCHC: 33.4 g/dL (ref 30.0–36.0)
MCV: 102.9 fL — ABNORMAL HIGH (ref 80.0–100.0)
Platelets: 149 10*3/uL — ABNORMAL LOW (ref 150–400)
RBC: 3.78 MIL/uL — ABNORMAL LOW (ref 4.22–5.81)
RDW: 12.2 % (ref 11.5–15.5)
WBC: 4.8 10*3/uL (ref 4.0–10.5)
nRBC: 0 % (ref 0.0–0.2)

## 2022-07-27 LAB — TROPONIN I (HIGH SENSITIVITY): Troponin I (High Sensitivity): 51 ng/L — ABNORMAL HIGH (ref <18)

## 2022-07-27 MED ORDER — METOPROLOL TARTRATE 25 MG PO TABS: 12.5000 mg | ORAL_TABLET | Freq: Two times a day (BID) | ORAL | 1 refills | Status: DC

## 2022-07-27 MED ORDER — METOPROLOL TARTRATE 5 MG/5ML IV SOLN
5.0000 mg | Freq: Once | INTRAVENOUS | Status: AC
  Administered 2022-07-27: 5 mg via INTRAVENOUS
  Filled 2022-07-27: qty 5

## 2022-07-27 NOTE — Discharge Instructions (Addendum)
We have provided a referral to cardiology.  It is very important that you follow-up.  The clinic should call you to schedule an appointment.  If you do not hear within the next several days you can call the number provided.  Take the metoprolol as prescribed.  If you feel lightheaded or dizzy or have a low blood pressure with that then you can stop taking it until you follow-up with a cardiologist.  Return to the ER immediate for new, worsening, or recurrent palpitations, weakness or lightheadedness, chest pain, difficulty breathing, a recurrence of the symptoms you had today, or any other new or worsening symptoms that concern you.

## 2022-07-27 NOTE — ED Provider Triage Note (Signed)
Emergency Medicine Provider Triage Evaluation Note  Thai Burgueno , a 76 y.o. male  was evaluated in triage.   Patient complains of weakness while walking the golf course.  Usually walks the entire course.  Got weak on the back 9.  No heart disease Review of Systems  Positive:  Negative:   Physical Exam  BP 125/66   Pulse (!) 126   Temp 98 F (36.7 C) (Oral)   Resp 18   SpO2 96%  Gen:   Awake, no distress   Resp:  Normal effort  MSK:   Moves extremities without difficulty  Other:    Medical Decision Making  Medically screening exam initiated at 2:33 PM.  Appropriate orders placed.  Cathan Gearin was informed that the remainder of the evaluation will be completed by another provider, this initial triage assessment does not replace that evaluation, and the importance of remaining in the ED until their evaluation is complete.  Patient is in A-fib RVR   Faythe Ghee, PA-C 07/27/22 1434

## 2022-07-27 NOTE — ED Triage Notes (Signed)
Pt to ED via POV from playing golf. Pt states about PTA he started feeling generalized weakness while playing golf. Pt reports vision is "fuzzy". Pt denies blood thinner. Pt reports no hx afib. EKG showing afib RVR

## 2022-07-27 NOTE — ED Provider Notes (Signed)
[facility] Provider Note    Event Date/Time   First MD Initiated Contact with Patient 07/27/22 1502     (approximate)   History   Weakness   HPI  Travis Lee is a 76 y.o. male with a history of ADHD and ED who presents with generalized weakness and lightheadedness, acute onset a few hours ago this afternoon while he was playing golf.  The patient states he started to feel somewhat better now.  He states he felt a general discomfort in his body but no focal chest pain or specific shortness of breath.  He did feel like his heart was racing.  He has not had this before.  The patient states that he exercised this morning but otherwise had no changes in his routine.  I reviewed the past medical records; the patient's most recent outpatient encounter was with gastroenterology in January for colonoscopy and in May with dermatology.  He has no recent ED visits or admissions.  He has no prior cardiac history.   Physical Exam   Triage Vital Signs: ED Triage Vitals  Enc Vitals Group     BP 07/27/22 1427 125/66     Pulse Rate 07/27/22 1427 (!) 126     Resp 07/27/22 1427 18     Temp 07/27/22 1427 98 F (36.7 C)     Temp Source 07/27/22 1427 Oral     SpO2 07/27/22 1427 96 %     Weight --      Height --      Head Circumference --      Peak Flow --      Pain Score 07/27/22 1428 0     Pain Loc --      Pain Edu? --      Excl. in GC? --     Most recent vital signs: Vitals:   07/27/22 1600 07/27/22 1615  BP: 102/70   Pulse: 63 65  Resp: 20 (!) 22  Temp:    SpO2: 98% 98%     General: Awake, no distress.  CV:  Good peripheral perfusion.  Irregular rhythm, borderline elevated rate. Resp:  Normal effort.  Lungs CTAB. Abd:  No distention.  Other:  No peripheral edema.   ED Results / Procedures / Treatments   Labs (all labs ordered are listed, but only abnormal results are displayed) Labs Reviewed  BASIC METABOLIC PANEL - Abnormal; Notable for the  following components:      Result Value   Glucose, Bld 131 (*)    BUN 28 (*)    Creatinine, Ser 1.41 (*)    GFR, Estimated 52 (*)    All other components within normal limits  CBC - Abnormal; Notable for the following components:   RBC 3.78 (*)    HCT 38.9 (*)    MCV 102.9 (*)    MCH 34.4 (*)    Platelets 149 (*)    All other components within normal limits  CBG MONITORING, ED - Abnormal; Notable for the following components:   Glucose-Capillary 158 (*)    All other components within normal limits  TROPONIN I (HIGH SENSITIVITY) - Abnormal; Notable for the following components:   Troponin I (High Sensitivity) 51 (*)    All other components within normal limits  RESP PANEL BY RT-PCR (RSV, FLU A&B, COVID)  RVPGX2  TROPONIN I (HIGH SENSITIVITY)     EKG  ED ECG REPORT I, Dionne Bucy, the attending physician, personally viewed and interpreted this ECG.  Date: 07/27/2022 EKG Time: 1432 Rate: 131 Rhythm: Atrial fibrillation with RVR QRS Axis: normal Intervals: normal ST/T Wave abnormalities: Nonspecific ST abnormalities Narrative Interpretation: no evidence of acute ischemia    ED ECG REPORT I, Dionne Bucy, the attending physician, personally viewed and interpreted this ECG.  Date: 07/27/2022 EKG Time: 1545 Rate: 66 Rhythm: normal sinus rhythm QRS Axis: normal Intervals: normal ST/T Wave abnormalities: normal Narrative Interpretation: no evidence of acute ischemia    RADIOLOGY  Chest X-ray: No focal consolidation or edema    PROCEDURES:  Critical Care performed: Yes, see critical care procedure note(s)  .Critical Care  Performed by: Dionne Bucy, MD Authorized by: Dionne Bucy, MD   Critical care provider statement:    Critical care time (minutes):  30   Critical care time was exclusive of:  Separately billable procedures and treating other patients   Critical care was necessary to treat or prevent imminent or life-threatening  deterioration of the following conditions:  Circulatory failure   Critical care was time spent personally by me on the following activities:  Development of treatment plan with patient or surrogate, discussions with consultants, evaluation of patient's response to treatment, examination of patient, ordering and review of laboratory studies, ordering and review of radiographic studies, ordering and performing treatments and interventions, pulse oximetry, re-evaluation of patient's condition, review of old charts and obtaining history from patient or surrogate    MEDICATIONS ORDERED IN ED: Medications  metoprolol tartrate (LOPRESSOR) injection 5 mg (5 mg Intravenous Given 07/27/22 1525)     IMPRESSION / MDM / ASSESSMENT AND PLAN / ED COURSE  I reviewed the triage vital signs and the nursing notes.  76 year old male with no prior cardiac history presents with generalized weakness and lightheadedness along with palpitations that are now somewhat improving.  EKG reveals atrial fibrillation with RVR which is new for the patient.  Differential diagnosis includes, but is not limited to, new onset atrial fibrillation, a flutter, other narrow complex dysrhythmia.  The patient has no clinical signs to suggest acute CHF.  We will obtain basic labs, cardiac enzymes, chest x-ray, give metoprolol for rate control and reassess.  Patient's presentation is most consistent with acute presentation with potential threat to life or bodily function.  The patient is on the cardiac monitor to evaluate for evidence of arrhythmia and/or significant heart rate changes.  ----------------------------------------- 4:27 PM on 07/27/2022 -----------------------------------------  After dose of metoprolol, the patient converted back to sinus rhythm.  His heart rate is now in the 60s.  His blood pressure is normal.  He is asymptomatic at this time.  Repeat EKG shows normal sinus rhythm with no ischemic changes.  The initial  troponin was minimally elevated.  This is likely due to demand ischemia due to the rate earlier.  There is no indication for a repeat troponin at this time as there is no clinical suspicion for ACS, especially given the normal EKG.  Other labs are unremarkable with normal electrolytes and no anemia.  Respiratory panel is negative.  I consulted Dr. Gwen Pounds from cardiology and discussed the case with him including the EKG and lab results.  He recommended starting the patient on metoprolol and having him follow-up with cardiology.  He did not recommend starting the patient on anticoagulation.  He agrees with discharge at this time.  I counseled the patient on the results of the workup and plan of care as well as the importance of close follow-up.  I answered all of his questions.  I  gave strict return precautions and he expressed understanding.   FINAL CLINICAL IMPRESSION(S) / ED DIAGNOSES   Final diagnoses:  Paroxysmal atrial fibrillation (HCC)     Rx / DC Orders   ED Discharge Orders          Ordered    Ambulatory referral to Cardiology  Status:  Canceled       Comments: If you have not heard from the Cardiology office within the next 72 hours please call [phone].   07/27/22 1624    metoprolol tartrate (LOPRESSOR) 25 MG tablet  2 times daily        07/27/22 1624    Ambulatory referral to Cardiology       Comments: If you have not heard from the Cardiology office within the next 72 hours please call [phone].   07/27/22 1626             Note:  This document was prepared using Dragon voice recognition software and may include unintentional dictation errors.    Dionne Bucy, MD 07/27/22 1630

## 2022-07-27 NOTE — ED Notes (Signed)
Pt placed on 12 lead monitor and pulse ox at this time. Xray in room at this time.

## 2022-07-27 NOTE — ED Notes (Signed)
Patient Alert and oriented to baseline. Stable and ambulatory to baseline. Patient verbalized understanding of the discharge instructions.  Patient belongings were taken by the patient.   

## 2022-07-27 NOTE — ED Notes (Signed)
Receiving RN, Crystal, made aware of pt.

## 2022-08-01 ENCOUNTER — Encounter: Payer: Self-pay | Admitting: Cardiology

## 2022-08-01 ENCOUNTER — Ambulatory Visit: Payer: Medicare Other | Admitting: Cardiology

## 2022-08-01 ENCOUNTER — Ambulatory Visit: Payer: Medicare Other | Attending: Cardiology | Admitting: Cardiology

## 2022-08-01 VITALS — BP 116/72 | HR 53 | Ht 72.0 in | Wt 185.8 lb

## 2022-08-01 DIAGNOSIS — I4891 Unspecified atrial fibrillation: Secondary | ICD-10-CM

## 2022-08-01 DIAGNOSIS — I480 Paroxysmal atrial fibrillation: Secondary | ICD-10-CM | POA: Diagnosis not present

## 2022-08-01 DIAGNOSIS — D6869 Other thrombophilia: Secondary | ICD-10-CM

## 2022-08-01 MED ORDER — APIXABAN 5 MG PO TABS: 5.0000 mg | ORAL_TABLET | Freq: Two times a day (BID) | ORAL | 11 refills | Status: DC

## 2022-08-01 NOTE — Patient Instructions (Addendum)
Medication Instructions:  Your physician has recommended you make the following change in your medication:   START Eliquis 5 mg twice a day Take extra metoprolol if you are having afib and if this does not help then give our office a call or go to ED for evaluation.   *If you need a refill on your cardiac medications before your next appointment, please call your pharmacy*   Lab Work: None  If you have labs (blood work) drawn today and your tests are completely normal, you will receive your results only by: MyChart Message (if you have MyChart) OR A paper copy in the mail If you have any lab test that is abnormal or we need to change your treatment, we will call you to review the results.   Testing/Procedures: Your physician has requested that you have an echocardiogram. Echocardiography is a painless test that uses sound waves to create images of your heart. It provides your doctor with information about the size and shape of your heart and how well your heart's chambers and valves are working. This procedure takes approximately one hour. There are no restrictions for this procedure. Please do NOT wear cologne, perfume, aftershave, or lotions (deodorant is allowed). Please arrive 15 minutes prior to your appointment time.    Follow-Up: At [facility], you and your health needs are our priority.  As part of our continuing mission to provide you with exceptional heart care, we have created designated Provider Care Teams.  These Care Teams include your primary Cardiologist (physician) and Advanced Practice Providers (APPs -  Physician Assistants and Nurse Practitioners) who all work together to provide you with the care you need, when you need it.  We recommend signing up for the patient portal called "MyChart".  Sign up information is provided on this After Visit Summary.  MyChart is used to connect with patients for Virtual Visits (Telemedicine).  Patients are able to view  lab/test results, encounter notes, upcoming appointments, etc.  Non-urgent messages can be sent to your provider as well.   To learn more about what you can do with MyChart, go to [email or web address].    Your next appointment:   3 month(s)  The format for your next appointment:   In Person  Provider:   [name], MD      Important Information About Sugar

## 2022-08-01 NOTE — Assessment & Plan Note (Addendum)
Main symptoms noted with A-fib was fatigue.  This is somewhat concerning and the fact that he could easily have the symptoms while sleeping and would not have known that he was in A-fib.  He clearly was symptomatic when awake which is reassuring.  It does not sound like his had any previous spells. He has had no angina type symptoms and works out excessively which would indicate is probably not an anginal or ischemic etiology.  His only real risk factors are his age of 74.  Does not have hypertension, diabetes, CHF, but may have some aortic plaque) CHA2DS2-VASc or therefore is to maybe 3.  His heart rate is pretty well-controlled and although we can increase his dose of metoprolol anymore with his resting bradycardia.  He broke relatively quickly with IV Lopressor which is reassuring.  Plan:  Check 2D echo to evaluate for structural abnormalities.  I do not think he needs an ischemic evaluation. For now continue with 12.5 mg twice daily Lopressor.   For breakthrough spells of A-fib, he will take additional 25 mg tab => if not resolved after an hour or 2 contact office for recommendations. However there is during the off hours, would recommend potentially considering ER visit for potential cardioversion if he is on standing dose Eliquis. At this point we will Start Eliquis 5 mg twice daily with plan for him to stay on it for minimum of 3 months and we can reassess the duration of care going forward.
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
## 2022-08-01 NOTE — Assessment & Plan Note (Signed)
This patients CHA2DS2-VASc Score and unadjusted Ischemic Stroke Rate (% per year) is equal to 2.2 % stroke rate/year from a score of 2 Above score calculated as 1 point each if present [CHF, HTN, DM, Vascular=MI/PAD/Aortic Plaque, Age if 44-74, or Male]; Above score calculated as 2 points each if present [Age > 75, or Stroke/TIA/TE]  See recommendation for anticoagulation We had a long talk about is CHA2DS2-VASc score, what that means and the concern for stroke prophylaxis.  I did explain that aspirin may provide some benefit but not nearly to the extent that full anticoagulation would provide.  My recommendation based on his CHA2DS2-VASc would be full anticoagulation. He is reluctant to be on long-term DOAC, despite long exhalation, not sure he fully understands. Plan for now will be to have him start Eliquis 5 mg twice daily continue the beta-blocker.  We can see him back in 3 months to reassess.  If he is not having any more breakthrough spells, we can consider going to a as needed basis Eliquis or if he has symptoms of A-fib, he goes back on Eliquis for at least a month at a time and otherwise covers himself with 81 mg aspirin.

## 2022-10-02 ENCOUNTER — Ambulatory Visit: Payer: Medicare Other | Attending: Cardiology

## 2022-10-02 DIAGNOSIS — I4891 Unspecified atrial fibrillation: Secondary | ICD-10-CM

## 2022-10-02 LAB — ECHOCARDIOGRAM COMPLETE
AR max vel: 2.83 cm2
AV Area VTI: 2.96 cm2
AV Area mean vel: 2.83 cm2
AV Mean grad: 4 mmHg
AV Peak grad: 6.9 mmHg
Ao pk vel: 1.31 m/s
Area-P 1/2: 3.01 cm2
Calc EF: 56.3 %
S' Lateral: 4.05 cm
Single Plane A2C EF: 56.7 %
Single Plane A4C EF: 56 %

## 2022-10-08 DIAGNOSIS — H5319 Other subjective visual disturbances: Secondary | ICD-10-CM | POA: Diagnosis not present

## 2022-10-08 DIAGNOSIS — G43B1 Ophthalmoplegic migraine, intractable: Secondary | ICD-10-CM | POA: Diagnosis not present

## 2022-10-30 DIAGNOSIS — D0422 Carcinoma in situ of skin of left ear and external auricular canal: Secondary | ICD-10-CM | POA: Diagnosis not present

## 2022-10-30 DIAGNOSIS — D2262 Melanocytic nevi of left upper limb, including shoulder: Secondary | ICD-10-CM | POA: Diagnosis not present

## 2022-10-30 DIAGNOSIS — L570 Actinic keratosis: Secondary | ICD-10-CM | POA: Diagnosis not present

## 2022-10-30 DIAGNOSIS — D485 Neoplasm of uncertain behavior of skin: Secondary | ICD-10-CM | POA: Diagnosis not present

## 2022-10-30 DIAGNOSIS — D225 Melanocytic nevi of trunk: Secondary | ICD-10-CM | POA: Diagnosis not present

## 2022-10-30 DIAGNOSIS — L400 Psoriasis vulgaris: Secondary | ICD-10-CM | POA: Diagnosis not present

## 2022-10-30 DIAGNOSIS — Z85828 Personal history of other malignant neoplasm of skin: Secondary | ICD-10-CM | POA: Diagnosis not present

## 2022-11-07 ENCOUNTER — Encounter: Payer: Self-pay | Admitting: Cardiology

## 2022-11-07 ENCOUNTER — Ambulatory Visit: Payer: Medicare Other | Attending: Cardiology | Admitting: Cardiology

## 2022-11-07 VITALS — BP 110/74 | HR 55 | Ht 72.0 in | Wt 185.4 lb

## 2022-11-07 DIAGNOSIS — I480 Paroxysmal atrial fibrillation: Secondary | ICD-10-CM | POA: Diagnosis not present

## 2022-11-07 DIAGNOSIS — D6869 Other thrombophilia: Secondary | ICD-10-CM

## 2022-11-07 DIAGNOSIS — I4891 Unspecified atrial fibrillation: Secondary | ICD-10-CM

## 2022-11-07 MED ORDER — METOPROLOL TARTRATE 25 MG PO TABS: 12.5000 mg | ORAL_TABLET | Freq: Two times a day (BID) | ORAL | 1 refills | Status: DC

## 2022-11-07 MED ORDER — APIXABAN 5 MG PO TABS: 5.0000 mg | ORAL_TABLET | Freq: Two times a day (BID) | ORAL | 11 refills | Status: AC | PRN

## 2022-11-07 NOTE — Patient Instructions (Signed)
Medication Instructions:  Your physician recommends that you continue on your current medications as directed. Please refer to the Current Medication list given to you today.  *If you need a refill on your cardiac medications before your next appointment, please call your pharmacy*  Lab Work: -None ordered If you have labs (blood work) drawn today and your tests are completely normal, you will receive your results only by: [place] (if you have MyChart) OR A paper copy in the mail If you have any lab test that is abnormal or we need to change your treatment, we will call you to review the results.  Testing/Procedures: -None ordered  Follow-Up: At [facility], you and your health needs are our priority.  As part of our continuing mission to provide you with exceptional heart care, we have created designated Provider Care Teams.  These Care Teams include your primary Cardiologist (physician) and Advanced Practice Providers (APPs -  Physician Assistants and Nurse Practitioners) who all work together to provide you with the care you need, when you need it.  We recommend signing up for the patient portal called "MyChart".  Sign up information is provided on this After Visit Summary.  MyChart is used to connect with patients for Virtual Visits (Telemedicine).  Patients are able to view lab/test results, encounter notes, upcoming appointments, etc.  Non-urgent messages can be sent to your provider as well.   To learn more about what you can do with MyChart, go to [email or web address].    Your next appointment:   6 month(s)  Provider:   You may see Glenetta Hew, MD or one of the following Advanced Practice Providers on your designated Care Team:   Murray Hodgkins, NP Christell Faith, PA-C Cadence Kathlen Mody, PA-C Gerrie Nordmann, NP  Other Instructions -None

## 2022-11-07 NOTE — Progress Notes (Signed)
Primary Care Provider: Maryland Lee, [place] Cardiologist: None Electrophysiologist: None  Clinic Note: Chief Complaint  Patient presents with   3 month follow up      Medications reviewed by the patient verbally.    Atrial Fibrillation    Patient stopped Eliquis after taking two tablets; he states, has extreme bleeding when he cuts himself and is afraid to take it.   ===================================  ASSESSMENT/PLAN   Problem List Items Addressed This Visit       Cardiology Problems   Paroxysmal atrial fibrillation (HCC) - Primary (Chronic)    Pretty symptomatic when he is in A-fib, this is reassuring because he would likely know if he goes back into it.  We talked about maybe try to avoid triggers etc.  He is tolerating the low-dose beta-blocker and if he has a breakthrough spell of A-fib he will take a full dose.  We also talked about the concept of intermittent coverage with DOAC if he has breakthrough spells of A-fib.  He would take it for least a month after conversion back to sinus.  Continue beta-blocker and start aspirin while holding Eliquis for PRN use.      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  He does not really have high blood pressure, and we did not identify any CAD or aortic plaque.  He just did not enjoy taking the Eliquis, noting significant bleeding or bruising and stopped it because he felt was unsafe.  We talked a lot heart about the CHADS2 score, and he is borderline.  He understands risks but he feels like he has not had any breakthrough spells of A-fib.  He does have a Kardia-Mobile which can be used to check for A-fib  Of course, the guideline recommendation would be DOAC, however as an alternative  he was agreeable to start taking aspirin 81 mg daily which does give some protection albeit nowhere near this amount of DOAC.  If he has a breakthrough spell of A-fib which she consents based on symptoms and then confirmed with Kardia-Mobile, he will then start back on Eliquis for at least a month after conversion.  This way he at least is protected in the interval after the initial onset.  He would not take aspirin plus Eliquis.  He will continue having the prescription for Eliquis -> discussed PRN usage.      Relevant Medications   apixaban (ELIQUIS) 5 MG TABS tablet   metoprolol tartrate (LOPRESSOR) 25 MG tablet   ===================================   HPI:    Travis Lee is a 77 y.o. male with PMH only notable for HLD ADHD and ED who is being seen today for the evaluation of NEW DIAGNOSIS OF A-FIB at the request of Travis Pink, MD.  Travis Lee was seen on 08/01/2022 for ER f/u at the request of his PCP.. => Patient noted a sense of generalized weakness and fatigue and maybe a little sense of his heart rate go fast but just overall worn out.  Noted to be in A-fib on evaluation in the ER.  He was treated with IV beta-blocker that spontaneously resolved the A-fib.  He felt much better afterwards.  Send his heart rate was back to normal he felt fine.  Remained on the beta-blocker and noticed his heart rate range was anywhere from 55-72 on his Samsung smart watch.  No further episodes of irregular beats palpitations.  He had played golf 3 times.  Also walk 2 or 3 miles. He did present to the emergency room back on the ninth with generalized weakness and fatigue and maybe a sense of his heart rate going fast for little bit but it was mostly that he just felt worn out.  He was found to be in A-fib and spontaneously converted with IV Lopressor.  He is now feeling fine with no residual effect.  He said it as soon as his heart rate broke he felt back to normal again and has been fine since.   His blood pressures been doing well-has been concerned about his pressure going lower with this low-dose of Lopressor but it is pressures are higher at home than they are here usually in the 120/70 range.  His heart rates have been anywhere from 55-72 on his [employer] smart watch.  He has not had any further irregular heartbeat episodes on his smart watch.  He is watching that closely.  Otherwise, he has not had any irregular heartbeats or palpitations.  No syncope or near-syncope.  No TIA or amaurosis fugax.  No claudication.  No bleeding issues.  No fatigue or exercise intolerance. CHA2DS2-Vac ~3 => started on Eliquis & continued Beta Blocker (Lopressor 12.5 mg BID - 25 mg for breakthrough.  Ordered 2 D Echo  Recent Hospitalizations:  No new visits  Reviewed  CV studies:    The following studies were reviewed today: (if available, images/films reviewed: From Epic Chart or Care Everywhere) 2 D Echo 10/02/2021: LV EF ~ 55-60%. Normal LV Fxn. No RWMA. Normal DFxn. Normal RV size & fxn - normal RAP. Mild RA dilation. Normal AoV & MV.   Interval History:   Travis Lee is a very pleasant and very active 77 year old gentleman who plays golf several days a week usually walks instead of rides.  He also goes brisk walking with his wife several times a day.  He returns today to discuss results of his echocardiogram which was relatively benign.  He said that he stopped taking Eliquis because of just significant bruising.  He says that he and his wife walk anywhere from 3 to 4 miles a day averaging up to 3.1 mph.  They try to walk about an hour.  Blood pressure does go up with exercise, but does read back down again.  He is on remarkably well since last visit he has not had any episodes to suggest recurrence of A-fib.  He does not actually want to take a full time and has not had episodes of A-fib that he can tell.  No even sensations of fast heart rates. No chest pain or pressure with rest exertion.  No  PND, orthopnea or edema.  He is not any bleeding issues since stopping Eliquis.  Cardiovascular ROS: no chest pain or dyspnea on exertion negative for - edema, irregular heartbeat, loss of consciousness, palpitations, rapid heart rate, shortness of breath, or lightheadedness, dizziness or wooziness, syncope/near syncope TIA/RCVS, claudication   REVIEWED OF SYSTEMS   Review of Systems  Constitutional:  Negative for malaise/fatigue (Only when he was in A-fib) and weight loss.  HENT:  Negative for congestion.   Respiratory:  Negative for cough and shortness of breath.   Gastrointestinal:  Negative for blood in stool, constipation and melena.  Genitourinary:  Negative for hematuria.  Musculoskeletal:  Positive for joint pain (Just mild aches and pains).  Neurological:  Positive for dizziness (Mild positional). Negative for seizures and weakness.  Psychiatric/Behavioral:  Negative for depression and memory loss. The patient is not nervous/anxious.        Just quite stressed   I have reviewed and (if needed) personally updated the patient's problem list, medications, allergies, past medical and surgical history, social and family history.   PAST MEDICAL HISTORY   Past Medical History:  Diagnosis Date   ADHD (attention deficit hyperactivity disorder)    ED (erectile dysfunction)    Paroxysmal atrial fibrillation ([place])    07/27/2022-[facility] ER: Presented with generalized weakness and lightheadedness that began few hours before presentation.  He was playing golf and started noticing general discomfort but no focal chest pain or specific symptom of dyspnea.  He did feel his heart racing and thought it was related to his exercising early in the morning. Found to be in A-fib RVR rate of 131 bpm. => IV Lopressor 5 mg given.  Follow-up EKG showed sinus rhythm rate 60s bpm. Dr. Nehemiah Massed was consulted and recommended starting beta-blocker and having a follow-up with cardiology.  Did not recommend  anticoagulation.  PAST SURGICAL HISTORY   Past Surgical History:  Procedure Laterality Date   COLONOSCOPY WITH PROPOFOL     COLONOSCOPY WITH PROPOFOL N/A 11/26/2021   Procedure: COLONOSCOPY WITH PROPOFOL;  Surgeon: Annamaria Helling, DO;  Location: [facility] ENDOSCOPY;  Service: Gastroenterology;  Laterality: N/A;   NO PAST SURGERIES     SHOULDER SURGERY     Treadmill stress echo September 18, 2012:  Baseline echo: Normal LV size and function.  RV normal size and function.  Mild 1+ MR.  1+ TR.  Normal AOV. Patient exercised 10.1 METS 8:30 min.  Max heart rate 146 bpm equals 94% MPHR for age.  Resting EKG normal.  Exercise EKG had sinus tachycardia with no ischemic changes.  Low Risk Duke Treadmill score.  Prestress global function normal, post stress global function normal with hyperdynamic response.  LOW RISK/NORMAL STUDY  Immunization History  Administered Date(s) Administered   Moderna SARS-COV2 Booster Vaccination 06/26/2020    MEDICATIONS/ALLERGIES   Current Meds  Medication Sig   ketoconazole (NIZORAL) 2 % shampoo Apply 1 application. topically as needed for irritation.   metoprolol tartrate (LOPRESSOR) 25 MG tablet Take 0.5 tablets (12.5 mg total) by mouth 2 (two) times daily.   sildenafil (REVATIO) 20 MG tablet Take 20 mg by mouth as needed. 1-5tabs  He has an existing prescription for Eliquis 5 mg twice daily that he is not taking.  Not on File  SOCIAL HISTORY/FAMILY HISTORY   Reviewed in Epic:   Social History   Tobacco Use   Smoking status: Never   Smokeless tobacco: Never  Vaping Use   Vaping Use: Never used  Substance Use Topics   Alcohol use: Never   Drug use: Never   Social History   Social History Narrative   Health Habits:   Current exercise activities include: cardiovascular workout on exercise equipment, walking and golf   Exercise: 5 times/week.   Diet: in general, a "healthy" diet   How many times in the past year has patient used an illegal drug  or used a prescription medication for non-medical reasons? 0 (>=1 is positive)   Has the patient used opioid medication within the last year? No    Family History  Problem Relation Age of Onset   COPD Mother  Bipolar disorder Mother    Glaucoma Mother    CAD Mother    Lung cancer Father    Bipolar disorder Sister     OBJCTIVE -PE, EKG, labs   Wt Readings from Last 3 Encounters:  11/07/22 185 lb 6 oz (84.1 kg)  08/01/22 185 lb 12.8 oz (84.3 kg)  11/26/21 180 lb (81.6 kg)    Physical Exam: BP 110/74 (BP Location: Left Arm, Patient Position: Sitting, Cuff Size: Normal)   Pulse (!) 55   Ht 6' (1.829 m)   Wt 185 lb 6 oz (84.1 kg)   SpO2 97%   BMI 25.14 kg/m  Physical Exam Vitals reviewed.  Constitutional:      General: He is not in acute distress.    Appearance: Normal appearance. He is normal weight. He is not ill-appearing or toxic-appearing.  HENT:     Head: Normocephalic and atraumatic.  Neck:     Vascular: No carotid bruit or JVD.  Cardiovascular:     Rate and Rhythm: Normal rate and regular rhythm. No extrasystoles are present.    Chest Wall: PMI is not displaced.     Pulses: Normal pulses.     Heart sounds: S1 normal and S2 normal. No murmur heard.    No friction rub. No gallop.  Pulmonary:     Effort: Pulmonary effort is normal. No respiratory distress.     Breath sounds: No wheezing, rhonchi or rales.  Chest:     Chest wall: No tenderness.  Musculoskeletal:        General: Normal range of motion.     Cervical back: Normal range of motion and neck supple.  Skin:    General: Skin is warm and dry.  Neurological:     General: No focal deficit present.     Mental Status: He is alert and oriented to person, place, and time.  Psychiatric:        Mood and Affect: Mood normal.        Behavior: Behavior normal.        Thought Content: Thought content normal.        Judgment: Judgment normal.     Adult ECG Report  Rate: 55;  Rhythm: sinus bradycardia and  normal axis, intervals and durations.  Normal voltage. ;   Narrative Interpretation: Stable  Recent Labs:   Lipid panel last checked in October 2022: TC 207, TG 92, HDL 55.5, LDL 133. No results found for: "CHOL", "HDL", "LDLCALC", "LDLDIRECT", "TRIG", "CHOLHDL" Lab Results  Component Value Date   CREATININE 1.41 (H) 07/27/2022   BUN 28 (H) 07/27/2022   NA 138 07/27/2022   K 4.1 07/27/2022   CL 105 07/27/2022   CO2 25 07/27/2022      Latest Ref Rng & Units 07/27/2022    2:39 PM  CBC  WBC 4.0 - 10.5 K/uL 4.8   Hemoglobin 13.0 - 17.0 g/dL 13.0   Hematocrit 39.0 - 52.0 % 38.9   Platelets 150 - 400 K/uL 149     No results found for: "HGBA1C" No results found for: "TSH"   ================================================== I spent a total of 24 minutes with the patient spent in direct patient consultation.  Additional time spent with chart review  / charting (studies, outside notes, etc): 13 min Total Time: 37 min  Current medicines are reviewed at length with the patient today.  (+/- concerns) Long talk about aspirin versus DOAC.  Notice: This dictation was prepared with Dragon dictation along with smart phrase  technology. Any transcriptional errors that result from this process are unintentional and may not be corrected upon review.   Studies Ordered:  Orders Placed This Encounter  Procedures   EKG 12-Lead   Meds ordered this encounter  Medications   apixaban (ELIQUIS) 5 MG TABS tablet    Sig: Take 1 tablet (5 mg total) by mouth 2 (two) times daily as needed (When in Afib).    Dispense:  60 tablet    Refill:  11   metoprolol tartrate (LOPRESSOR) 25 MG tablet    Sig: Take 0.5 tablets (12.5 mg total) by mouth 2 (two) times daily.    Dispense:  180 tablet    Refill:  1    Patient Instructions / Medication Changes & Studies & Tests Ordered   Patient Instructions  Medication Instructions:  Your physician recommends that you continue on your current medications as  directed. Please refer to the Current Medication list given to you today.  *If you need a refill on your cardiac medications before your next appointment, please call your pharmacy*  Lab Work: -None ordered If you have labs (blood work) drawn today and your tests are completely normal, you will receive your results only by: [place] (if you have MyChart) OR A paper copy in the mail If you have any lab test that is abnormal or we need to change your treatment, we will call you to review the results.  Testing/Procedures: -None ordered  Follow-Up: At [facility], you and your health needs are our priority.  As part of our continuing mission to provide you with exceptional heart care, we have created designated Provider Care Teams.  These Care Teams include your primary Cardiologist (physician) and Advanced Practice Providers (APPs -  Physician Assistants and Nurse Practitioners) who all work together to provide you with the care you need, when you need it.  We recommend signing up for the patient portal called "MyChart".  Sign up information is provided on this After Visit Summary.  MyChart is used to connect with patients for Virtual Visits (Telemedicine).  Patients are able to view lab/test results, encounter notes, upcoming appointments, etc.  Non-urgent messages can be sent to your provider as well.   To learn more about what you can do with MyChart, go to [email or web address].    Your next appointment:   6 month(s)  Provider:   You may see Glenetta Hew, MD or one of the following Advanced Practice Providers on your designated Care Team:   Murray Hodgkins, NP Christell Faith, PA-C Cadence Kathlen Mody, PA-C Gerrie Nordmann, NP  Other Instructions -None   -> Take aspirin 81 mg daily, if he Wilfred Lacy has breakthrough spells of A-fib, start back on Eliquis 5 mg twice daily for 1 month after breaking.   Leonie Man, MD, MS Glenetta Hew, M.D., M.S. Interventional  Cardiologist  [facility]   [address] [place], [place]  [address] [phone]           Fax [phone]    Thank you for choosing [facility] in [place]!!

## 2022-11-09 ENCOUNTER — Encounter: Payer: Self-pay | Admitting: Cardiology

## 2022-11-09 NOTE — Assessment & Plan Note (Addendum)
CHA2DS2-VASc score is 2 based on age of 80.  He does not really have high blood pressure, and we did not identify any CAD or aortic plaque.  He just did not enjoy taking the Eliquis, noting significant bleeding or bruising and stopped it because he felt was unsafe.  We talked a lot heart about the CHADS2 score, and he is borderline.  He understands risks but he feels like he has not had any breakthrough spells of A-fib.  He does have a Kardia-Mobile which can be used to check for A-fib  Of course, the guideline recommendation would be DOAC, however as an alternative he was agreeable to start taking aspirin 81 mg daily which does give some protection albeit nowhere near this amount of DOAC.  If he has a breakthrough spell of A-fib which she consents based on symptoms and then confirmed with Kardia-Mobile, he will then start back on Eliquis for at least a month after conversion.  This way he at least is protected in the interval after the initial onset.  He would not take aspirin plus Eliquis.  He will continue having the prescription for Eliquis -> discussed PRN usage.

## 2022-11-09 NOTE — Assessment & Plan Note (Signed)
Pretty symptomatic when he is in A-fib, this is reassuring because he would likely know if he goes back into it.  We talked about maybe try to avoid triggers etc.  He is tolerating the low-dose beta-blocker and if he has a breakthrough spell of A-fib he will take a full dose.  We also talked about the concept of intermittent coverage with DOAC if he has breakthrough spells of A-fib.  He would take it for least a month after conversion back to sinus.  Continue beta-blocker and start aspirin while holding Eliquis for PRN use.

## 2023-01-01 DIAGNOSIS — D2321 Other benign neoplasm of skin of right ear and external auricular canal: Secondary | ICD-10-CM | POA: Diagnosis not present

## 2023-01-01 DIAGNOSIS — L905 Scar conditions and fibrosis of skin: Secondary | ICD-10-CM | POA: Diagnosis not present

## 2023-01-01 DIAGNOSIS — D0422 Carcinoma in situ of skin of left ear and external auricular canal: Secondary | ICD-10-CM | POA: Diagnosis not present

## 2023-05-12 NOTE — Progress Notes (Unsigned)
Cardiology Office Note:  .   Date:  05/15/2023  ID:  [name], DOB [birth year], MRN [id number] PCP: Travis Mina, MD   HeartCare Providers Cardiologist:  [name], MD     Chief Complaint  Patient presents with   Follow-up    6 month follow up. Patient feels great today. Medications reviewed verbally.   Atrial Fibrillation    Has not had any breakthrough spells.  Watches his heart rate routinely on his watch, and anytime he feels anything unusual he checks his Kardia-Mobile    Patient Profile: .     Travis Lee is a 77 y.o. male with a PMH notable for HLD, ADHD, ED as well as recently diagnosed PAF (symptomatic) who presents here for 54-month follow-up at the request of Travis Mina, MD.  07/27/2022-presented to [facility] ER with symptoms of generalized weakness and lightheadedness as well as dyspnea and general discomfort.  Not necessarily chest discomfort but felt his heart racing. => Found to be in A-fib RVR with rates in the 130s.  Broke with IV Lopressor.  [name] was last seen on November 07, 2022-had not had any breakthrough spells of A-fib..  Continued on beta-blocker, but asked about long-term use of DOAC.  We talked about potentially using it as needed for breakthrough spells.  He was not in favor of being on long-term anticoagulation.  Discussed using Kardia-Mobile device to determine if he is in A-fib in which case he would start back on DOAC for 1 month.  Would otherwise stay on 81 mg aspirin.    Subjective  INTERVAL HPI Lee Oscarson  returns today doing well.  Has been checking his [facility] 2 whenever he feels unusual or increase HR, fatigue -- has not seen any tracing read as Afib.  Occasional orthostatic dizziness/ lightheadedness if gets up too quick.  Doing well to remember to hydrate & takes a quick Werther's butterscotch to help with energy.  Whenever he feels tired -- he checks for Afib -- on watch & Kardia.  Has not had A fib. No  syncope/near syncope.   Cardiovascular ROS: no chest pain or dyspnea on exertion positive for - occasional orthostatic dizziness.  negative for - edema, irregular heartbeat, orthopnea, palpitations, paroxysmal nocturnal dyspnea, rapid heart rate, shortness of breath, or near syncope / syncope, TIA / amaurosis fugax, claudication.  Melena. Hematochezia, hematuria, - but may have mild epistaxis (with picking nose -- rare, ~ 1 time in past year).  ROS:  Review of Systems - Negative except for mild Joint pains muscle aches     Objective  Studies Reviewed: [name]   EKG Interpretation Date/Time:  Thursday May 15 2023 09:06:00 EDT Ventricular Rate:  60 PR Interval:  162 QRS Duration:  92 QT Interval:  428 QTC Calculation: 428 R Axis:   69  Text Interpretation: Normal sinus rhythm Normal ECG When compared with ECG of 27-Jul-2022 15:45, No significant change since last tracing Confirmed by [name] ([id number]) on 05/15/2023 9:20:59 AM   ECHO 10/02/2022: Normal LV size and function.  EF 55 to 60%.  No RWMA.  Normal diastolic function.  Normal LA size.  Normal AoV and MV-trivial MR.  Normal RV size and function.  Mild RA dilation.  Normal RAP.  Risk Assessment/Calculations:    CHA2DS2-VASc Score = 2   This indicates a 2.2% annual risk of stroke. The patient's score is based upon: CHF History: 0 HTN History: 0 Diabetes History: 0 Stroke History: 0 Vascular Disease History: 0 Age Score:  2 Gender Score: 0   Per patient request he is not on longstanding DOAC-using aspirin and then only DOAC for breakthrough spells of A-fib.           Physical Exam:   VS:  BP 112/80 (BP Location: Left Arm, Patient Position: Sitting, Cuff Size: Normal)   Pulse 60   Ht 6' (1.829 m)   Wt 183 lb (83 kg)   SpO2 98%   BMI 24.82 kg/m    Wt Readings from Last 3 Encounters:  05/15/23 183 lb (83 kg)  11/07/22 185 lb 6 oz (84.1 kg)  08/01/22 185 lb 12.8 oz (84.3 kg)    GEN: Well nourished, well  developed in no acute distress; healthy appearing  NECK: No JVD; No carotid bruits CARDIAC: Normal S1, S2; RRR, no murmurs, rubs, gallops RESPIRATORY:  Clear to auscultation without rales, wheezing or rhonchi ; nonlabored, good air movement. ABDOMEN: Soft, non-tender, non-distended EXTREMITIES:  No edema; No deformity      ASSESSMENT AND PLAN: .    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  Per his request, he does not want to be on long-term DOAC.  Plan since he is relatively symptomatic when he has A-fib would be for him to evaluate with his [name], if it does show that he is in A-fib, he will go on DOAC for 1 month.  Depending on how many episodes he does have, if these are occurring quite frequently then he would be to going on standing dose.      Relevant Medications   metoprolol tartrate (LOPRESSOR) 25 MG tablet   Paroxysmal atrial fibrillation (HCC) - Primary (Chronic)    Thankfully, no breakthrough spells as far as he can tell.  He was very symptomatic when he had it anytime he feels somewhat unusual and a bit of tired, fatigued, palpitations etc. he checks his watch to see if his heart rate is stable, if is any concern whatsoever he checks his Kardia-Mobile.  He also checks his [name] routinely a couple times a day regardless and has not had any breakthrough spells of A-fib.  He has not been taking metoprolol after the first month of being on it he gradually came off it and states that he has felt fine and not being on it.  The  The plan would be for him to have both metoprolol and the Eliquis that if he were to go into A-fib, he would start Lopressor 12.5 mg twice daily, with the first dose being 25 mg along with the Eliquis 5 mg twice daily until he comes out of it.  He will then continue the Eliquis and  beta-blocker for least 1 month.      Relevant Medications   metoprolol tartrate (LOPRESSOR) 25 MG tablet   Other Relevant Orders   EKG 12-Lead (Completed)            Dispo: Return in about 1 year (around 05/14/2024) for 1 Yr Follow-up.  Total time spent: 19 min spent with patient + 12 min spent charting = 31 min      Signed, Marykay Lex, MD, MS [name], M.D., M.S. Interventional Cardiologist  [facility]  Pager # [phone] Phone # [phone] [address] [place], [place] [address]

## 2023-05-15 ENCOUNTER — Encounter: Payer: Self-pay | Admitting: Cardiology

## 2023-05-15 ENCOUNTER — Ambulatory Visit: Payer: Medicare Other | Attending: Cardiology | Admitting: Cardiology

## 2023-05-15 VITALS — BP 112/80 | HR 60 | Ht 72.0 in | Wt 183.0 lb

## 2023-05-15 DIAGNOSIS — I480 Paroxysmal atrial fibrillation: Secondary | ICD-10-CM | POA: Diagnosis not present

## 2023-05-15 DIAGNOSIS — D6869 Other thrombophilia: Secondary | ICD-10-CM | POA: Diagnosis not present

## 2023-05-15 MED ORDER — METOPROLOL TARTRATE 25 MG PO TABS: 12.5000 mg | ORAL_TABLET | Freq: Two times a day (BID) | ORAL | Status: AC | PRN

## 2023-05-15 NOTE — Patient Instructions (Signed)
Medication Instructions:  Your physician recommends that you continue on your current medications as directed. Please refer to the Current Medication list given to you today.  *If you need a refill on your cardiac medications before your next appointment, please call your pharmacy*  Lab Work: -None ordered  Testing/Procedures: -None ordered  Follow-Up: At [facility], you and your health needs are our priority.  As part of our continuing mission to provide you with exceptional heart care, we have created designated Provider Care Teams.  These Care Teams include your primary Cardiologist (physician) and Advanced Practice Providers (APPs -  Physician Assistants and Nurse Practitioners) who all work together to provide you with the care you need, when you need it.  Your next appointment:   1 year(s)  Provider:   You may see [name], MD or one of the following Advanced Practice Providers on your designated Care Team:   Nicolasa Ducking, NP Eula Listen, PA-C Cadence Fransico Michael, PA-C Charlsie Quest, NP    Other Instructions -None

## 2023-05-15 NOTE — Assessment & Plan Note (Signed)
Thankfully, no breakthrough spells as far as he can tell.  He was very symptomatic when he had it anytime he feels somewhat unusual and a bit of tired, fatigued, palpitations etc. he checks his watch to see if his heart rate is stable, if is any concern whatsoever he checks his [place].  He also checks his [name] routinely a couple times a day regardless and has not had any breakthrough spells of A-fib.  He has not been taking metoprolol after the first month of being on it he gradually came off it and states that he has felt fine and not being on it.  The  The plan would be for him to have both metoprolol and the Eliquis that if he were to go into A-fib, he would start Lopressor 12.5 mg twice daily, with the first dose being 25 mg along with the Eliquis 5 mg twice daily until he comes out of it.  He will then continue the Eliquis and beta-blocker for least 1 month.

## 2023-05-15 NOTE — Assessment & Plan Note (Addendum)
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  Per his request, he does not want to be on long-term DOAC.  Plan since he is relatively symptomatic when he has A-fib would be for him to evaluate with his [place], if it does show that he is in A-fib, he will go on DOAC for 1 month.  Depending on how many episodes he does have, if these are occurring quite frequently then he would be to going on standing dose.

## 2024-06-03 ENCOUNTER — Ambulatory Visit: Admitting: Cardiology
